# Patient Record
Sex: Female | Born: 1970 | Race: Black or African American | Marital: Married | Smoking: Never smoker
Health system: Southern US, Community
[De-identification: ages and names within clinical notes are randomized; demographics above are authoritative.]

## PROBLEM LIST (undated history)

## (undated) DIAGNOSIS — Z1371 Encounter for nonprocreative screening for genetic disease carrier status: Secondary | ICD-10-CM

## (undated) DIAGNOSIS — E7800 Pure hypercholesterolemia, unspecified: Secondary | ICD-10-CM

## (undated) DIAGNOSIS — Z803 Family history of malignant neoplasm of breast: Secondary | ICD-10-CM

## (undated) DIAGNOSIS — I499 Cardiac arrhythmia, unspecified: Secondary | ICD-10-CM

## (undated) DIAGNOSIS — Z9189 Other specified personal risk factors, not elsewhere classified: Secondary | ICD-10-CM

## (undated) HISTORY — PX: ESSURE TUBAL LIGATION: SUR464

## (undated) HISTORY — DX: Pure hypercholesterolemia, unspecified: E78.00

## (undated) HISTORY — PX: COMBINED HYSTEROSCOPY DIAGNOSTIC / D&C: SUR297

## (undated) HISTORY — PX: CERVICAL CERCLAGE: SHX1329

## (undated) HISTORY — DX: Family history of malignant neoplasm of breast: Z80.3

---

## 2004-10-23 ENCOUNTER — Inpatient Hospital Stay: Payer: Self-pay | Admitting: Obstetrics & Gynecology

## 2004-10-28 ENCOUNTER — Inpatient Hospital Stay: Payer: Self-pay | Admitting: Obstetrics & Gynecology

## 2005-12-14 ENCOUNTER — Observation Stay: Payer: Self-pay | Admitting: Obstetrics & Gynecology

## 2009-09-04 ENCOUNTER — Encounter: Payer: Self-pay | Admitting: Obstetrics and Gynecology

## 2009-09-21 ENCOUNTER — Encounter: Payer: Self-pay | Admitting: Obstetrics and Gynecology

## 2009-10-26 ENCOUNTER — Encounter: Payer: Self-pay | Admitting: Maternal & Fetal Medicine

## 2010-03-05 ENCOUNTER — Observation Stay: Payer: Self-pay

## 2010-03-08 ENCOUNTER — Observation Stay: Payer: Self-pay | Admitting: Obstetrics and Gynecology

## 2010-03-15 ENCOUNTER — Inpatient Hospital Stay: Payer: Self-pay

## 2010-08-17 ENCOUNTER — Ambulatory Visit: Payer: Self-pay

## 2013-11-11 ENCOUNTER — Ambulatory Visit: Payer: Self-pay | Admitting: Obstetrics & Gynecology

## 2015-01-07 ENCOUNTER — Encounter: Payer: Self-pay | Admitting: Emergency Medicine

## 2015-01-07 ENCOUNTER — Emergency Department: Payer: No Typology Code available for payment source

## 2015-01-07 ENCOUNTER — Emergency Department
Admission: EM | Admit: 2015-01-07 | Discharge: 2015-01-07 | Disposition: A | Discharge status: Home / Self Care | Payer: No Typology Code available for payment source | Attending: Emergency Medicine | Admitting: Emergency Medicine

## 2015-01-07 DIAGNOSIS — Y92410 Unspecified street and highway as the place of occurrence of the external cause: Secondary | ICD-10-CM | POA: Insufficient documentation

## 2015-01-07 DIAGNOSIS — S20219A Contusion of unspecified front wall of thorax, initial encounter: Secondary | ICD-10-CM | POA: Insufficient documentation

## 2015-01-07 DIAGNOSIS — Y9389 Activity, other specified: Secondary | ICD-10-CM | POA: Diagnosis not present

## 2015-01-07 DIAGNOSIS — S299XXA Unspecified injury of thorax, initial encounter: Secondary | ICD-10-CM | POA: Diagnosis present

## 2015-01-07 DIAGNOSIS — S60221A Contusion of right hand, initial encounter: Secondary | ICD-10-CM | POA: Insufficient documentation

## 2015-01-07 DIAGNOSIS — Y998 Other external cause status: Secondary | ICD-10-CM | POA: Insufficient documentation

## 2015-01-07 MED ORDER — HYDROCODONE-ACETAMINOPHEN 5-325 MG PO TABS: 1.0000 | ORAL_TABLET | ORAL | Status: DC | PRN
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
## 2015-01-07 NOTE — Discharge Instructions (Signed)
Chest Contusion A contusion is a deep bruise. Bruises happen when an injury causes bleeding under the skin. Signs of bruising include pain, puffiness (swelling), and discolored skin. The bruise may turn blue, purple, or yellow.  HOME CARE  Put ice on the injured area.  Put ice in a plastic bag.  Place a towel between the skin and the bag.  Leave the ice on for 15-20 minutes at a time, 03-04 times a day for the first 48 hours.  Only take medicine as told by your doctor.  Rest.  Take deep breaths (deep-breathing exercises) as told by your doctor.  Stop smoking if you smoke.  Do not lift objects over 5 pounds (2.3 kilograms) for 3 days or longer if told by your doctor. GET HELP RIGHT AWAY IF:   You have more bruising or puffiness.  You have pain that gets worse.  You have trouble breathing.  You are dizzy, weak, or pass out (faint).  You have blood in your pee (urine) or poop (stool).  You cough up or throw up (vomit) blood.  Your puffiness or pain is not helped with medicines. MAKE SURE YOU:   Understand these instructions.  Will watch your condition.  Will get help right away if you are not doing well or get worse. Document Released: 10/30/2007 Document Revised: 02/05/2012 Document Reviewed: 11/04/2011 [facility] Patient Information 2015 [place], [place]. This information is not intended to replace advice given to you by your health care provider. Make sure you discuss any questions you have with your health care provider.

## 2015-01-10 ENCOUNTER — Encounter: Payer: Self-pay | Admitting: Emergency Medicine

## 2015-01-10 ENCOUNTER — Ambulatory Visit
Admission: EM | Admit: 2015-01-10 | Discharge: 2015-01-10 | Disposition: A | Discharge status: Home / Self Care | Payer: No Typology Code available for payment source | Attending: Family Medicine | Admitting: Family Medicine

## 2015-01-10 DIAGNOSIS — S60011D Contusion of right thumb without damage to nail, subsequent encounter: Secondary | ICD-10-CM

## 2015-01-10 DIAGNOSIS — S60011A Contusion of right thumb without damage to nail, initial encounter: Secondary | ICD-10-CM

## 2015-01-10 MED ORDER — MELOXICAM 15 MG PO TABS: 15.0000 mg | ORAL_TABLET | Freq: Every day | ORAL | Status: DC

## 2015-01-10 NOTE — Discharge Instructions (Signed)
Contusion °A contusion is a deep bruise. Contusions are the result of an injury that caused bleeding under the skin. The contusion may turn blue, purple, or yellow. Minor injuries will give you a painless contusion, but more severe contusions may stay painful and swollen for a few weeks.  °CAUSES  °A contusion is usually caused by a blow, trauma, or direct force to an area of the body. °SYMPTOMS  °· Swelling and redness of the injured area. °· Bruising of the injured area. °· Tenderness and soreness of the injured area. °· Pain. °DIAGNOSIS  °The diagnosis can be made by taking a history and physical exam. An X-ray, CT scan, or MRI may be needed to determine if there were any associated injuries, such as fractures. °TREATMENT  °Specific treatment will depend on what area of the body was injured. In general, the best treatment for a contusion is resting, icing, elevating, and applying cold compresses to the injured area. Over-the-counter medicines may also be recommended for pain control. Ask your caregiver what the best treatment is for your contusion. °HOME CARE INSTRUCTIONS  °· Put ice on the injured area. °¨ Put ice in a plastic bag. °¨ Place a towel between your skin and the bag. °¨ Leave the ice on for 15-20 minutes, 3-4 times a day, or as directed by your health care provider. °· Only take over-the-counter or prescription medicines for pain, discomfort, or fever as directed by your caregiver. Your caregiver may recommend avoiding anti-inflammatory medicines (aspirin, ibuprofen, and naproxen) for 48 hours because these medicines may increase bruising. °· Rest the injured area. °· If possible, elevate the injured area to reduce swelling. °SEEK IMMEDIATE MEDICAL CARE IF:  °· You have increased bruising or swelling. °· You have pain that is getting worse. °· Your swelling or pain is not relieved with medicines. °MAKE SURE YOU:  °· Understand these instructions. °· Will watch your condition. °· Will get help right  away if you are not doing well or get worse. °Document Released: 02/20/2005 Document Revised: 05/18/2013 Document Reviewed: 03/18/2011 °[employer]® Patient Information ©2015 [employer]. This information is not intended to replace advice given to you by your health care provider. Make sure you discuss any questions you have with your health care provider. ° °
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
## 2015-02-28 LAB — HM MAMMOGRAPHY

## 2015-06-12 ENCOUNTER — Ambulatory Visit: Payer: Self-pay | Admitting: Dietician

## 2016-03-07 LAB — HM PAP SMEAR: HM PAP: NEGATIVE

## 2016-04-21 ENCOUNTER — Ambulatory Visit
Admission: EM | Admit: 2016-04-21 | Discharge: 2016-04-21 | Disposition: A | Discharge status: Home / Self Care | Payer: BLUE CROSS/BLUE SHIELD | Attending: Emergency Medicine | Admitting: Emergency Medicine

## 2016-04-21 ENCOUNTER — Encounter: Payer: Self-pay | Admitting: Emergency Medicine

## 2016-04-21 DIAGNOSIS — J069 Acute upper respiratory infection, unspecified: Secondary | ICD-10-CM

## 2016-04-21 DIAGNOSIS — M542 Cervicalgia: Secondary | ICD-10-CM

## 2016-04-21 DIAGNOSIS — M62838 Other muscle spasm: Secondary | ICD-10-CM

## 2016-04-21 MED ORDER — HYDROCOD POLST-CPM POLST ER 10-8 MG/5ML PO SUER: 5.0000 mL | Freq: Two times a day (BID) | ORAL | 0 refills | Status: DC | PRN

## 2016-04-21 MED ORDER — MOMETASONE FUROATE 50 MCG/ACT NA SUSP: 2.0000 | Freq: Every day | NASAL | 0 refills | Status: DC

## 2016-04-21 MED ORDER — METAXALONE 800 MG PO TABS: 800.0000 mg | ORAL_TABLET | Freq: Three times a day (TID) | ORAL | 0 refills | Status: DC

## 2016-04-21 MED ORDER — IBUPROFEN 800 MG PO TABS: 800.0000 mg | ORAL_TABLET | Freq: Three times a day (TID) | ORAL | 0 refills | Status: DC

## 2016-04-21 NOTE — ED Triage Notes (Signed)
Patient c/o HAs, nasal congestion, runny nose, and cough for the past 3-4 days. Patient denies fevers.

## 2016-04-21 NOTE — ED Provider Notes (Signed)
HPI  SUBJECTIVE:  Amy Buckley is a 45 y.o. female who presents with scratchy sore throat, nasal congestion, postnasal drip, nonproductive cough, right ear popping and fullness for one week. Can't sleep at night secondary to cough. Also describes intermittent right neck pain of variable duration that she describes as a pulled muscle for the past 4 days. She reports stiffness in the right side, no stiffness with flexion and extension. Symptoms are worse at night with lying down, head rotation, better with ibuprofen 600 mg and heat no antipyretic in the past 6-8 hours. No fevers, bodyaches, neck or facial rash. No numbness, daily, weakness in arm. No trauma to her neck. She is not sure she slept on it funny. She states that she had solution. This before, was thought to be a neck strain and it resolved on its own. Past medical history negative for diabetes, hypertension, asthma, emphysema, COPD, in a cup of ice. She is not a smoker. LMP: 2 years ago. JQZ:ESPQZ N McLean-Scocuzza, MD   History reviewed. No pertinent past medical history.  History reviewed. No pertinent surgical history.  History reviewed. No pertinent family history.  Social History  Substance Use Topics  . Smoking status: Never Smoker  . Smokeless tobacco: Never Used  . Alcohol use No    No current facility-administered medications for this encounter.   Current Outpatient Prescriptions:  .  chlorpheniramine-HYDROcodone (TUSSIONEX PENNKINETIC ER) 10-8 MG/5ML SUER, Take 5 mLs by mouth every 12 (twelve) hours as needed for cough., Disp: 120 mL, Rfl: 0 .  ibuprofen (ADVIL,MOTRIN) 800 MG tablet, Take 1 tablet (800 mg total) by mouth 3 (three) times daily., Disp: 30 tablet, Rfl: 0 .  metaxalone (SKELAXIN) 800 MG tablet, Take 1 tablet (800 mg total) by mouth 3 (three) times daily., Disp: 21 tablet, Rfl: 0 .  mometasone (NASONEX) 50 MCG/ACT nasal spray, Place 2 sprays into the nose daily., Disp: 17 g, Rfl: 0  No Known  Allergies   ROS  As noted in HPI.   Physical Exam  BP (!) 143/86 (BP Location: Left Arm)   Pulse 73   Temp 98.6 F (37 C) (Oral)   Resp 16   Ht 5' 8"  (1.727 m)   Wt 231 lb (104.8 kg)   SpO2 100%   BMI 35.12 kg/m   Constitutional: Well developed, well nourished, no acute distress Eyes:  EOMI, conjunctiva normal bilaterally HENT: Normocephalic, atraumatic,mucus membranes moist. Mild nasal congestion, no sinus tenderness. TMs normal bilaterally. No air-fluid levels. Difficult to visualize posterior oropharynx but does not appear red. Tonsils appear normal, uvula midline. Neck: Positive right-sided strap muscle and trapezial tenderness with muscle spasm. No meningismus. No rash. No cervical lymph nodes. Respiratory: Normal inspiratory effort lungs clear bilaterally good air movement no chest wall tenderness Cardiovascular: Normal rate regular rhythm no murmurs rubs or gallops GI: nondistended skin: No rash, skin intact Musculoskeletal: no deformities Neurologic: Alert & oriented x 3, no focal neuro deficits Psychiatric: Speech and behavior appropriate   ED Course   Medications - No data to display  No orders of the defined types were placed in this encounter.   No results found for this or any previous visit (from the past 24 hour(s)). No results found.  ED Clinical Impression  Upper respiratory tract infection, unspecified type  Neck pain  Muscle spasm   ED Assessment/Plan  Presentation most consistent with URI and neck strain/muscle spasm. Doubt meningitis or shingles at this point in time, no evidence of otitis, pneumonia. We'll  send home with ibuprofen 800 mg 3 times a day with Tylenol, tramadol for severe pain, muscle relaxant, and a cough syrup, Nasonex. She is to start Mucinex D and saline nasal irrigation no indications for antibiotics at this time. Follow up with PMD as needed.  Discussed  MDM, plan and followup with patient. Discussed sn/sx that should  prompt return to the ED. Patient agrees with plan.   Meds ordered this encounter  Medications  . ibuprofen (ADVIL,MOTRIN) 800 MG tablet    Sig: Take 1 tablet (800 mg total) by mouth 3 (three) times daily.    Dispense:  30 tablet    Refill:  0  . mometasone (NASONEX) 50 MCG/ACT nasal spray    Sig: Place 2 sprays into the nose daily.    Dispense:  17 g    Refill:  0  . chlorpheniramine-HYDROcodone (TUSSIONEX PENNKINETIC ER) 10-8 MG/5ML SUER    Sig: Take 5 mLs by mouth every 12 (twelve) hours as needed for cough.    Dispense:  120 mL    Refill:  0  . metaxalone (SKELAXIN) 800 MG tablet    Sig: Take 1 tablet (800 mg total) by mouth 3 (three) times daily.    Dispense:  21 tablet    Refill:  0    *This clinic note was created using [employer]. Therefore, there may be occasional mistakes despite careful proofreading.  ?   Melynda Ripple, MD 04/21/16 1900

## 2016-04-21 NOTE — Discharge Instructions (Signed)
Start Mucinex D and saline nasal irrigation. Use a neti pot or Milta Deiters med sinus rinse This will help with the nasal congestion and postnasal drip, which will help decrease your cough.

## 2016-04-23 ENCOUNTER — Other Ambulatory Visit (HOSPITAL_COMMUNITY): Payer: Self-pay | Admitting: *Deleted

## 2016-04-24 ENCOUNTER — Ambulatory Visit (HOSPITAL_COMMUNITY)
Admission: RE | Admit: 2016-04-24 | Discharge: 2016-04-24 | Disposition: A | Discharge status: Home / Self Care | Payer: BLUE CROSS/BLUE SHIELD | Source: Ambulatory Visit | Attending: Internal Medicine | Admitting: Internal Medicine

## 2016-04-24 DIAGNOSIS — E2740 Unspecified adrenocortical insufficiency: Secondary | ICD-10-CM | POA: Diagnosis present

## 2016-04-24 LAB — CORTISOL-AM, BLOOD: Cortisol - AM: 18.2 ug/dL (ref 6.7–22.6)

## 2016-04-24 LAB — ACTH STIMULATION, 3 TIME POINTS
CORTISOL 30 MIN: 13.4 ug/dL
CORTISOL 60 MIN: 16.8 ug/dL
CORTISOL BASE: 6.7 ug/dL

## 2016-04-24 MED ORDER — COSYNTROPIN 0.25 MG IJ SOLR
0.2500 mg | Freq: Once | INTRAMUSCULAR | Status: AC
  Administered 2016-04-24: 0.25 mg via INTRAVENOUS
  Filled 2016-04-24: qty 0.25

## 2017-04-10 ENCOUNTER — Encounter: Payer: Self-pay | Admitting: Obstetrics & Gynecology

## 2017-04-10 ENCOUNTER — Ambulatory Visit (INDEPENDENT_AMBULATORY_CARE_PROVIDER_SITE_OTHER): Payer: BLUE CROSS/BLUE SHIELD | Admitting: Obstetrics & Gynecology

## 2017-04-10 VITALS — BP 120/80 | HR 83 | Ht 68.0 in | Wt 230.0 lb

## 2017-04-10 DIAGNOSIS — Z01419 Encounter for gynecological examination (general) (routine) without abnormal findings: Secondary | ICD-10-CM | POA: Diagnosis not present

## 2017-04-10 DIAGNOSIS — N912 Amenorrhea, unspecified: Secondary | ICD-10-CM | POA: Diagnosis not present

## 2017-04-10 DIAGNOSIS — Z0000 Encounter for general adult medical examination without abnormal findings: Secondary | ICD-10-CM

## 2017-04-10 DIAGNOSIS — Z1239 Encounter for other screening for malignant neoplasm of breast: Secondary | ICD-10-CM

## 2017-04-10 DIAGNOSIS — Z1231 Encounter for screening mammogram for malignant neoplasm of breast: Secondary | ICD-10-CM | POA: Diagnosis not present

## 2017-04-10 NOTE — Progress Notes (Signed)
HPI:      [name] is a 46 y.o. [phone] who LMP was 2 years ago,she presents today for her annual examination. The patient has no complaints today. The patient is sexually active. Her last pap: approximate date 2017 and was normal and last mammogram: approximate date 2016 and was normal. The patient does perform self breast exams.  There is notable family history of breast or ovarian cancer in her family.  The patient has regular exercise: yes.  The patient denies current symptoms of depression.  No period in 2 years.  Prior Essure.  No hormones.  Rare hot flash.  GYN History: Contraception: tubal ligation  PMHx: History reviewed. No pertinent past medical history. Past Surgical History:  Procedure Laterality Date  . CERVICAL CERCLAGE    . CESAREAN SECTION    . COMBINED HYSTEROSCOPY DIAGNOSTIC / D&C    . ESSURE TUBAL LIGATION     Family History  Problem Relation Age of Onset  . Diabetes Mother   . Hypertension Mother   . Breast cancer Mother    Social History   Tobacco Use  . Smoking status: Never Smoker  . Smokeless tobacco: Never Used  Substance Use Topics  . Alcohol use: No  . Drug use: No  Child is 52 years old at [facility]  Current Outpatient Medications:  .  pantoprazole (PROTONIX) 20 MG tablet, , Disp: , Rfl:  .  chlorpheniramine-HYDROcodone (TUSSIONEX PENNKINETIC ER) 10-8 MG/5ML SUER, Take 5 mLs by mouth every 12 (twelve) hours as needed for cough. (Patient not taking: Reported on 04/10/2017), Disp: 120 mL, Rfl: 0 .  ibuprofen (ADVIL,MOTRIN) 800 MG tablet, Take 1 tablet (800 mg total) by mouth 3 (three) times daily. (Patient not taking: Reported on 04/10/2017), Disp: 30 tablet, Rfl: 0 .  metaxalone (SKELAXIN) 800 MG tablet, Take 1 tablet (800 mg total) by mouth 3 (three) times daily. (Patient not taking: Reported on 04/10/2017), Disp: 21 tablet, Rfl: 0 .  mometasone (NASONEX) 50 MCG/ACT nasal spray, Place 2 sprays into the nose daily. (Patient not taking:  Reported on 04/10/2017), Disp: 17 g, Rfl: 0 Allergies: Patient has no known allergies.  Review of Systems  Constitutional: Negative for chills, fever and malaise/fatigue.  HENT: Negative for congestion, sinus pain and sore throat.   Eyes: Negative for blurred vision and pain.  Respiratory: Negative for cough and wheezing.   Cardiovascular: Negative for chest pain and leg swelling.  Gastrointestinal: Negative for abdominal pain, constipation, diarrhea, heartburn, nausea and vomiting.  Genitourinary: Negative for dysuria, frequency, hematuria and urgency.  Musculoskeletal: Negative for back pain, joint pain, myalgias and neck pain.  Skin: Negative for itching and rash.  Neurological: Negative for dizziness, tremors and weakness.  Endo/Heme/Allergies: Does not bruise/bleed easily.  Psychiatric/Behavioral: Negative for depression. The patient is not nervous/anxious and does not have insomnia.    Objective: BP 120/80   Pulse 83   Ht 5' 8"  (1.727 m)   Wt 230 lb (104.3 kg)   BMI 34.97 kg/m   Filed Weights   04/10/17 0954  Weight: 230 lb (104.3 kg)   Body mass index is 34.97 kg/m. Physical Exam  Constitutional: She is oriented to person, place, and time. She appears well-developed and well-nourished. No distress.  Genitourinary: Rectum normal, vagina normal and uterus normal. Pelvic exam was performed with patient supine. There is no rash or lesion on the right labia. There is no rash or lesion on the left labia. Vagina exhibits no lesion. No bleeding  in the vagina. Right adnexum does not display mass and does not display tenderness. Left adnexum does not display mass and does not display tenderness. Cervix does not exhibit motion tenderness, lesion, friability or polyp.   Uterus is mobile and midaxial. Uterus is not enlarged or exhibiting a mass.  HENT:  Head: Normocephalic and atraumatic. Head is without laceration.  Right Ear: Hearing normal.  Left Ear: Hearing normal.  Nose: No  epistaxis.  No foreign bodies.  Mouth/Throat: Uvula is midline, oropharynx is clear and moist and mucous membranes are normal.  Eyes: Pupils are equal, round, and reactive to light.  Neck: Normal range of motion. Neck supple. No thyromegaly present.  Cardiovascular: Normal rate and regular rhythm. Exam reveals no gallop and no friction rub.  No murmur heard. Pulmonary/Chest: Effort normal and breath sounds normal. No respiratory distress. She has no wheezes. Right breast exhibits no mass, no skin change and no tenderness. Left breast exhibits no mass, no skin change and no tenderness.  Abdominal: Soft. Bowel sounds are normal. She exhibits no distension. There is no tenderness. There is no rebound.  Musculoskeletal: Normal range of motion.  Neurological: She is alert and oriented to person, place, and time. No cranial nerve deficit.  Skin: Skin is warm and dry.  Psychiatric: She has a normal mood and affect. Judgment normal.  Vitals reviewed.  Assessment:  ANNUAL EXAM 1. Annual physical exam   2. Screening for breast cancer   3. Amenorrhea    Screening Plan:            1.  Cervical Screening-  Pap smear schedule reviewed with patient, due 2020  2. Breast screening- Exam annually and mammogram>40 planned   3. Labs today due to amenorrhea  4. Contraception: bilateral tubal ligation    5. Amenorrhea - FSH/LH - Testosterone, Free, Total, SHBG - TSH - [place] - Concern for amenorrhea and uterine risk for hyperplasia/cancer discussed Consideration for EMB, Provera, other based on results    F/U  Return in about 1 year (around 04/10/2018) for Annual.  Barnett Applebaum, MD, [facility], [facility] 04/10/2017  10:17 AM

## 2017-04-10 NOTE — Patient Instructions (Signed)
PAP every three years Mammogram every year    Call [phone] to schedule at [facility] yearly (with PCP)

## 2017-04-12 LAB — TESTOSTERONE, FREE, TOTAL, SHBG
SEX HORMONE BINDING: 60.6 nmol/L (ref 24.6–122.0)
Testosterone, Free: 2.4 pg/mL (ref 0.0–4.2)
Testosterone: 29 ng/dL (ref 8–48)

## 2017-04-12 LAB — FSH/LH
FSH: 57.7 m[IU]/mL
LH: 37.9 m[IU]/mL

## 2017-04-12 LAB — TSH: TSH: 1.15 u[IU]/mL (ref 0.450–4.500)

## 2017-04-15 ENCOUNTER — Encounter: Payer: Self-pay | Admitting: Obstetrics and Gynecology

## 2017-04-16 ENCOUNTER — Telehealth: Payer: Self-pay

## 2017-04-16 NOTE — Telephone Encounter (Signed)
Pt inquiring about recent lab results. OM#[phone]

## 2017-04-16 NOTE — Telephone Encounter (Signed)
Please advise 

## 2017-04-19 NOTE — Telephone Encounter (Signed)
Will discus at next appt (this week).  Let her know all OK.

## 2017-04-21 NOTE — Telephone Encounter (Signed)
Called pt no answer °

## 2017-04-21 NOTE — Telephone Encounter (Signed)
Called again no answer

## 2017-04-25 ENCOUNTER — Encounter: Payer: Self-pay | Admitting: Obstetrics & Gynecology

## 2017-04-25 ENCOUNTER — Ambulatory Visit: Payer: BLUE CROSS/BLUE SHIELD | Admitting: Obstetrics & Gynecology

## 2017-04-25 ENCOUNTER — Ambulatory Visit (INDEPENDENT_AMBULATORY_CARE_PROVIDER_SITE_OTHER): Payer: BLUE CROSS/BLUE SHIELD

## 2017-04-25 VITALS — BP 120/80 | HR 57 | Ht 68.0 in | Wt 233.0 lb

## 2017-04-25 DIAGNOSIS — N912 Amenorrhea, unspecified: Secondary | ICD-10-CM

## 2017-04-25 NOTE — Progress Notes (Signed)
  HPI: Amenorrhea Patient presents for evaluation of amenorrhea. Currently periods are absent. Bleeding is none. Periods were not regular in the past occurring every a few months. Patient has no relevant history of abnormal sexual development and recent labs c/w elevated LH and FSH but normal test and TSH. Is there a chance of pregnancy? no. HCG lab test done? no, not done. Factors that may be contributory to menstrual abnormalities include: early menopause, obesity, PCOS. Previous treatments for menstrual abnormalities include: none.  Ultrasound demonstrates no masses seen, ES 3 mm, no cysts These findings are Pelvis normal    PMHx: She  has a past medical history of Family history of breast cancer. Also,  has a past surgical history that includes Essure tubal ligation; Cesarean section; Combined hysteroscopy diagnostic / D&C; and Cervical cerclage., family history includes Breast cancer (age of onset: 4) in her mother; Diabetes in her mother; Hypertension in her mother; Lung cancer (age of onset: 50) in her father.,  reports that  has never smoked. she has never used smokeless tobacco. She reports that she does not drink alcohol or use drugs.  She has a current medication list which includes the following prescription(s): chlorpheniramine-hydrocodone, ibuprofen, metaxalone, mometasone, and pantoprazole. Also, has No Known Allergies.  Review of Systems  All other systems reviewed and are negative.  Objective: BP 120/80   Pulse (!) 57   Ht 5' 8"  (1.727 m)   Wt 233 lb (105.7 kg)   BMI 35.43 kg/m   Physical examination Constitutional NAD, Conversant  Skin No rashes, lesions or ulceration.   Extremities: Moves all appropriately.  Normal ROM for age. No lymphadenopathy.  Neuro: Grossly intact  Psych: Oriented to PPT.  Normal mood. Normal affect.   Assessment:  Amenorrhea  As hormones (labs) suggest menopause and [place] normal with no endometrial thickening, no need for medical or surgical  therapy.  Monitor for s/sx menopause.  A total of 15 minutes were spent face-to-face with the patient during this encounter and over half of that time dealt with counseling and coordination of care.  Barnett Applebaum, MD, [facility], [facility] 04/25/2017  10:50 AM

## 2017-05-01 ENCOUNTER — Ambulatory Visit
Admission: RE | Admit: 2017-05-01 | Discharge: 2017-05-01 | Disposition: A | Discharge status: Home / Self Care | Payer: BLUE CROSS/BLUE SHIELD | Source: Ambulatory Visit | Attending: Obstetrics & Gynecology | Admitting: Obstetrics & Gynecology

## 2017-05-01 DIAGNOSIS — Z1231 Encounter for screening mammogram for malignant neoplasm of breast: Secondary | ICD-10-CM | POA: Insufficient documentation

## 2017-05-01 DIAGNOSIS — Z1239 Encounter for other screening for malignant neoplasm of breast: Secondary | ICD-10-CM

## 2017-05-02 ENCOUNTER — Inpatient Hospital Stay
Admission: RE | Admit: 2017-05-02 | Discharge: 2017-05-02 | Disposition: A | Discharge status: Home / Self Care | Payer: Self-pay | Source: Ambulatory Visit | Attending: *Deleted | Admitting: *Deleted

## 2017-05-02 ENCOUNTER — Other Ambulatory Visit: Payer: Self-pay | Admitting: *Deleted

## 2017-05-02 DIAGNOSIS — Z9289 Personal history of other medical treatment: Secondary | ICD-10-CM

## 2018-03-27 DIAGNOSIS — Z9189 Other specified personal risk factors, not elsewhere classified: Secondary | ICD-10-CM

## 2018-03-27 DIAGNOSIS — Z1371 Encounter for nonprocreative screening for genetic disease carrier status: Secondary | ICD-10-CM

## 2018-03-27 HISTORY — DX: Other specified personal risk factors, not elsewhere classified: Z91.89

## 2018-03-27 HISTORY — DX: Encounter for nonprocreative screening for genetic disease carrier status: Z13.71

## 2018-04-13 ENCOUNTER — Ambulatory Visit: Payer: BLUE CROSS/BLUE SHIELD | Admitting: Obstetrics & Gynecology

## 2018-04-16 ENCOUNTER — Encounter: Payer: Self-pay | Admitting: Obstetrics & Gynecology

## 2018-04-16 ENCOUNTER — Ambulatory Visit (INDEPENDENT_AMBULATORY_CARE_PROVIDER_SITE_OTHER): Payer: BLUE CROSS/BLUE SHIELD | Admitting: Obstetrics & Gynecology

## 2018-04-16 VITALS — BP 120/80 | Ht 68.0 in | Wt 226.0 lb

## 2018-04-16 DIAGNOSIS — Z1211 Encounter for screening for malignant neoplasm of colon: Secondary | ICD-10-CM

## 2018-04-16 DIAGNOSIS — Z01419 Encounter for gynecological examination (general) (routine) without abnormal findings: Secondary | ICD-10-CM

## 2018-04-16 DIAGNOSIS — Z1239 Encounter for other screening for malignant neoplasm of breast: Secondary | ICD-10-CM

## 2018-04-16 DIAGNOSIS — Z803 Family history of malignant neoplasm of breast: Secondary | ICD-10-CM | POA: Insufficient documentation

## 2018-04-16 DIAGNOSIS — Z780 Asymptomatic menopausal state: Secondary | ICD-10-CM | POA: Insufficient documentation

## 2018-04-16 DIAGNOSIS — Z0000 Encounter for general adult medical examination without abnormal findings: Secondary | ICD-10-CM

## 2018-04-16 NOTE — Patient Instructions (Addendum)
PAP every three years Mammogram every year    Call [phone] to schedule at [facility] Colonoscopy every 10 years, schedule soon Labs yearly (with PCP)

## 2018-04-16 NOTE — Progress Notes (Signed)
HPI:      [name] is a 47 y.o. [phone] who LMP was in the past, she presents today for her annual examination.  The patient has no complaints today. The patient is sexually active. Herlast pap: approximate date 2017 and was normal and last mammogram: approximate date 2018 and was normal.  The patient does perform self breast exams.  There is notable family history of breast or ovarian cancer in her family- BREAST CANCER; prior BRCA NEG testing done around 2011. The patient is not taking hormone replacement therapy. Patient denies post-menopausal vaginal bleeding.   The patient has regular exercise: yes. The patient denies current symptoms of depression.  Occas urgency, rare LOU.  No nocturia.  Occas hot flash  GYN Hx: Last Colonoscopy:never ago.  PMHx: Past Medical History:  Diagnosis Date  . Family history of breast cancer    11/18 genetic testing letter sent  . Hypercholesterolemia    Past Surgical History:  Procedure Laterality Date  . CERVICAL CERCLAGE    . CESAREAN SECTION    . COMBINED HYSTEROSCOPY DIAGNOSTIC / D&C    . ESSURE TUBAL LIGATION     Family History  Problem Relation Age of Onset  . Diabetes Mother   . Hypertension Mother   . Breast cancer Mother 73  . Lung cancer Father 56   Social History   Tobacco Use  . Smoking status: Never Smoker  . Smokeless tobacco: Never Used  Substance Use Topics  . Alcohol use: No  . Drug use: No    Current Outpatient Medications:  .  chlorpheniramine-HYDROcodone (TUSSIONEX PENNKINETIC ER) 10-8 MG/5ML SUER, Take 5 mLs by mouth every 12 (twelve) hours as needed for cough. (Patient not taking: Reported on 04/10/2017), Disp: 120 mL, Rfl: 0 .  ibuprofen (ADVIL,MOTRIN) 800 MG tablet, Take 1 tablet (800 mg total) by mouth 3 (three) times daily. (Patient not taking: Reported on 04/10/2017), Disp: 30 tablet, Rfl: 0 .  metaxalone (SKELAXIN) 800 MG tablet, Take 1 tablet (800 mg total) by mouth 3 (three) times daily. (Patient  not taking: Reported on 04/10/2017), Disp: 21 tablet, Rfl: 0 .  mometasone (NASONEX) 50 MCG/ACT nasal spray, Place 2 sprays into the nose daily. (Patient not taking: Reported on 04/10/2017), Disp: 17 g, Rfl: 0 .  pantoprazole (PROTONIX) 20 MG tablet, , Disp: , Rfl:  Allergies: Patient has no known allergies.  Review of Systems  Constitutional: Negative for chills, fever and malaise/fatigue.  HENT: Negative for congestion, sinus pain and sore throat.   Eyes: Negative for blurred vision and pain.  Respiratory: Negative for cough and wheezing.   Cardiovascular: Negative for chest pain and leg swelling.  Gastrointestinal: Negative for abdominal pain, constipation, diarrhea, heartburn, nausea and vomiting.  Genitourinary: Negative for dysuria, frequency, hematuria and urgency.  Musculoskeletal: Negative for back pain, joint pain, myalgias and neck pain.  Skin: Negative for itching and rash.  Neurological: Negative for dizziness, tremors and weakness.  Endo/Heme/Allergies: Does not bruise/bleed easily.  Psychiatric/Behavioral: Negative for depression. The patient is not nervous/anxious and does not have insomnia.     Objective: BP 120/80   Ht _0  (1.727 m)   Wt 226 lb (102.5 kg)   BMI 34.36 kg/m   Filed Weights   04/16/18 1032  Weight: 226 lb (102.5 kg)   Body mass index is 34.36 kg/m. Physical Exam  Constitutional: She is oriented to person, place, and time. She appears well-developed and well-nourished. No distress.  Genitourinary: Rectum normal, vagina normal and  uterus normal. Pelvic exam was performed with patient supine. There is no rash or lesion on the right labia. There is no rash or lesion on the left labia. Vagina exhibits no lesion. No bleeding in the vagina. Right adnexum does not display mass and does not display tenderness. Left adnexum does not display mass and does not display tenderness. Cervix does not exhibit motion tenderness, lesion, friability or polyp.   Uterus  is mobile and midaxial. Uterus is not enlarged or exhibiting a mass.  HENT:  Head: Normocephalic and atraumatic. Head is without laceration.  Right Ear: Hearing normal.  Left Ear: Hearing normal.  Nose: No epistaxis.  No foreign bodies.  Mouth/Throat: Uvula is midline, oropharynx is clear and moist and mucous membranes are normal.  Eyes: Pupils are equal, round, and reactive to light.  Neck: Normal range of motion. Neck supple. No thyromegaly present.  Cardiovascular: Normal rate and regular rhythm. Exam reveals no gallop and no friction rub.  No murmur heard. Pulmonary/Chest: Effort normal and breath sounds normal. No respiratory distress. She has no wheezes. Right breast exhibits no mass, no skin change and no tenderness. Left breast exhibits no mass, no skin change and no tenderness.  Abdominal: Soft. Bowel sounds are normal. She exhibits no distension. There is no tenderness. There is no rebound.  Musculoskeletal: Normal range of motion.  Neurological: She is alert and oriented to person, place, and time. No cranial nerve deficit.  Skin: Skin is warm and dry.  Psychiatric: She has a normal mood and affect. Judgment normal.  Vitals reviewed.   Assessment: Annual Exam 1. Annual physical exam   2. Screening for breast cancer   3. Screen for colon cancer   4. Menopause   5. Family history of breast cancer    Plan:            1.  Cervical Screening-  Pap smear schedule reviewed with patient  2. Breast screening- Exam annually and mammogram scheduled soon  3. Colonoscopy every 10 years soon, Hemoccult testing after age 60  4. Labs managed by PCP  5. Counseling for hormonal therapy: none  6. Update testing for her having high risk factors for hereditary cancers discussed (prior BRCA neg done in 2011).  She desires to have done.  7. [place] for menopause    F/U  Return in about 1 year (around 04/17/2019) for Annual.  Barnett Applebaum, MD, [facility], [facility] 04/16/2018  11:05 AM

## 2018-04-17 LAB — FOLLICLE STIMULATING HORMONE: FSH: 60.7 m[IU]/mL

## 2018-05-01 ENCOUNTER — Other Ambulatory Visit: Payer: Self-pay | Admitting: Obstetrics & Gynecology

## 2018-05-01 NOTE — Progress Notes (Unsigned)
Update testing done. T-C model 29% lifetime risk for breast cancer Rec Breast exam and MMG and MRI

## 2018-05-04 ENCOUNTER — Ambulatory Visit
Admission: RE | Admit: 2018-05-04 | Discharge: 2018-05-04 | Disposition: A | Discharge status: Home / Self Care | Payer: BLUE CROSS/BLUE SHIELD | Source: Ambulatory Visit | Attending: Obstetrics & Gynecology | Admitting: Obstetrics & Gynecology

## 2018-05-04 ENCOUNTER — Encounter: Payer: Self-pay | Admitting: Obstetrics and Gynecology

## 2018-05-04 DIAGNOSIS — Z1239 Encounter for other screening for malignant neoplasm of breast: Secondary | ICD-10-CM

## 2018-05-04 HISTORY — DX: Encounter for nonprocreative screening for genetic disease carrier status: Z13.71

## 2018-05-04 HISTORY — DX: Other specified personal risk factors, not elsewhere classified: Z91.89

## 2018-05-11 ENCOUNTER — Encounter: Payer: Self-pay | Admitting: Obstetrics & Gynecology

## 2018-05-11 ENCOUNTER — Other Ambulatory Visit: Payer: Self-pay | Admitting: Obstetrics & Gynecology

## 2018-05-11 ENCOUNTER — Encounter: Payer: Self-pay | Admitting: *Deleted

## 2018-06-05 ENCOUNTER — Encounter: Payer: Self-pay | Admitting: Emergency Medicine

## 2018-06-05 ENCOUNTER — Other Ambulatory Visit: Payer: Self-pay

## 2018-06-05 ENCOUNTER — Ambulatory Visit
Admission: EM | Admit: 2018-06-05 | Discharge: 2018-06-05 | Disposition: A | Discharge status: Home / Self Care | Payer: BLUE CROSS/BLUE SHIELD | Attending: Family Medicine | Admitting: Family Medicine

## 2018-06-05 DIAGNOSIS — M7521 Bicipital tendinitis, right shoulder: Secondary | ICD-10-CM

## 2018-06-05 MED ORDER — NAPROXEN 500 MG PO TABS: 500.0000 mg | ORAL_TABLET | Freq: Two times a day (BID) | ORAL | 0 refills | Status: DC

## 2018-06-05 NOTE — ED Provider Notes (Signed)
[facility]    CSN: [id number] Arrival date & time: 06/05/18  0806     History   Chief Complaint Chief Complaint  Patient presents with  . Shoulder Pain    right    HPI Amy Buckley is a 48 y.o. female.   HPI  -year-old female presents with right shoulder pain that started on Wednesday morning 2 days before this visit.  States she has very limited range of motion.  Is worse when she attempts to raise her arm is away from her side or trying to comb her hair or fasten her bra.  He works in a [employer] and has been pulling clots with her right dominant hand more than what she had been accustomed to.  Especially on Monday 2 days before the onset of her severe pain.  It is very painful at nighttime when she is sleeping.  Neck is comfortable and without significant pain.  She has no right extremity radiation or numbness.        Past Medical History:  Diagnosis Date  . BRCA negative 03/2018   MyRisk neg except GALNT12 VUS  . Family history of breast cancer   . Hypercholesterolemia   . Increased risk of breast cancer 03/2018   IBIS=29.2%    Patient Active Problem List   Diagnosis Date Noted  . Family history of breast cancer 04/16/2018  . Menopause 04/16/2018  . Annual physical exam 04/10/2017  . Amenorrhea 04/10/2017    Past Surgical History:  Procedure Laterality Date  . CERVICAL CERCLAGE    . CESAREAN SECTION    . COMBINED HYSTEROSCOPY DIAGNOSTIC / D&C    . ESSURE TUBAL LIGATION      OB History    Gravida  6   Para  1   Term  1   Preterm      AB  5   Living  1     SAB  5   TAB      Ectopic      Multiple      Live Births               Home Medications    Prior to Admission medications   Medication Sig Start Date End Date Taking? Authorizing Provider  ibuprofen (ADVIL,MOTRIN) 800 MG tablet Take 1 tablet (800 mg total) by mouth 3 (three) times daily. 04/21/16  Yes Melynda Ripple, MD  naproxen (NAPROSYN) 500 MG  tablet Take 1 tablet (500 mg total) by mouth 2 (two) times daily with a meal. 06/05/18   Lorin Picket, PA-C    Family History Family History  Problem Relation Age of Onset  . Diabetes Mother   . Hypertension Mother   . Breast cancer Mother 59  . Lung cancer Father 62  . Breast cancer Paternal Aunt     Social History Social History   Tobacco Use  . Smoking status: Never Smoker  . Smokeless tobacco: Never Used  Substance Use Topics  . Alcohol use: No  . Drug use: No     Allergies   Patient has no known allergies.   Review of Systems Review of Systems  Constitutional: Positive for activity change. Negative for appetite change, chills, fatigue and fever.  Musculoskeletal: Positive for arthralgias and myalgias.  All other systems reviewed and are negative.    Physical Exam Triage Vital Signs ED Triage Vitals  Enc Vitals Group     BP 06/05/18 0820 125/69     Pulse  Rate 06/05/18 0820 65     Resp 06/05/18 0820 16     Temp 06/05/18 0820 98 F (36.7 C)     Temp Source 06/05/18 0820 Oral     SpO2 06/05/18 0820 100 %     Weight 06/05/18 0817 226 lb (102.5 kg)     Height 06/05/18 0817 5' 8"  (1.727 m)     Head Circumference --      Peak Flow --      Pain Score 06/05/18 0817 8     Pain Loc --      Pain Edu? --      Excl. in [place]? --    No data found.  Updated Vital Signs BP 125/69 (BP Location: Left Arm)   Pulse 65   Temp 98 F (36.7 C) (Oral)   Resp 16   Ht 5' 8"  (1.727 m)   Wt 226 lb (102.5 kg)   SpO2 100%   BMI 34.36 kg/m   Visual Acuity Right Eye Distance:   Left Eye Distance:   Bilateral Distance:    Right Eye Near:   Left Eye Near:    Bilateral Near:     Physical Exam Vitals signs and nursing note reviewed.  Constitutional:      General: She is not in acute distress.    Appearance: Normal appearance. She is not ill-appearing, toxic-appearing or diaphoretic.  HENT:     Head: Normocephalic.     Nose: Nose normal.     Mouth/Throat:      Mouth: Mucous membranes are moist.  Eyes:     General:        Right eye: No discharge.        Left eye: No discharge.     Conjunctiva/sclera: Conjunctivae normal.  Neck:     Musculoskeletal: Normal range of motion and neck supple. No neck rigidity or muscular tenderness.  Musculoskeletal:     Comments: Exam of the right shoulder shows patient to have severe pain so that she does not want to participate with the examination.  He has extremely limited range of motion even passively.  She is very reluctant to allow examination.  There is no tenderness of the clavicle.  Please see neck examination.  Neurovascular function distally is intact.  Maximum tenderness is sharply localized over the coracoid process reproducing her symptoms.  Neurological:     Mental Status: She is alert.      UC Treatments / Results  Labs (all labs ordered are listed, but only abnormal results are displayed) Labs Reviewed - No data to display  EKG None  Radiology No results found.  Procedures Procedures (including critical care time)  Medications Ordered in UC Medications - No data to display  Initial Impression / Assessment and Plan / UC Course  I have reviewed the triage vital signs and the nursing notes.  Pertinent labs & imaging results that were available during my care of the patient were reviewed by me and considered in my medical decision making (see chart for details).   Has a bicipital tendinitis of the proximal.  I have told the patient that she is to do tender and not cooperative today to do a meaningful exam.  I demonstrated pendulum exercises to her that she will perform 3-4 times daily for 1 to 2 minutes each time.  Placed her on Naprosyn for anti-inflammatory effect.  Provided her with a sling that she may use to give her comfort.  Also provided her with the  name of an orthopedic surgeon that she may follow-up for possible injection of the area and more meaningful examination when she has  less painful.   Final Clinical Impressions(s) / UC Diagnoses   Final diagnoses:  Tendonitis of upper biceps tendon of right shoulder     Discharge Instructions     Apply ice 20 minutes out of every 2 hours 4-5 times daily for comfort.  Be sure to do the pendulum exercises that was demonstrated to you today.  Perform them 3-4 times daily 1 to 2 minutes each time.   ED Prescriptions    Medication Sig Dispense Auth. Provider   naproxen (NAPROSYN) 500 MG tablet Take 1 tablet (500 mg total) by mouth 2 (two) times daily with a meal. 60 tablet Lorin Picket, PA-C     Controlled Substance Prescriptions [place] Controlled Substance Registry consulted? Not Applicable   Lorin Picket, PA-C 06/05/18 2015

## 2018-06-05 NOTE — Discharge Instructions (Addendum)
Apply ice 20 minutes out of every 2 hours 4-5 times daily for comfort.  Be sure to do the pendulum exercises that was demonstrated to you today.  Perform them 3-4 times daily 1 to 2 minutes each time.

## 2018-06-05 NOTE — ED Triage Notes (Signed)
Patient c/o right shoulder pain that started on Wed morning.  Patient states that she has limited range of motion in her right shoulder and her pain is worse when she tries to raise her right arm.

## 2018-06-19 ENCOUNTER — Telehealth: Payer: Self-pay | Admitting: Gastroenterology

## 2018-06-19 NOTE — Telephone Encounter (Signed)
Patient called to schedule a colonoscopy after receiving a letter. Please call Monday 06-22-2018 after 3:00pm to schedule.

## 2018-06-22 NOTE — Telephone Encounter (Signed)
Patient stated she discussed her colonoscopy with her PCP to see if she needs to have her colonoscopy.  She said her PCP advised that she did not need to have the colonoscopy at this time since she has no family history of colon cancer.  Referral has been canceled.  Thanks [employer]

## 2019-04-19 ENCOUNTER — Other Ambulatory Visit (HOSPITAL_COMMUNITY)
Admission: RE | Admit: 2019-04-19 | Discharge: 2019-04-19 | Disposition: A | Discharge status: Home / Self Care | Payer: BC Managed Care – PPO | Source: Ambulatory Visit | Attending: Obstetrics & Gynecology | Admitting: Obstetrics & Gynecology

## 2019-04-19 ENCOUNTER — Encounter: Payer: Self-pay | Admitting: Obstetrics & Gynecology

## 2019-04-19 ENCOUNTER — Ambulatory Visit (INDEPENDENT_AMBULATORY_CARE_PROVIDER_SITE_OTHER): Payer: BC Managed Care – PPO | Admitting: Obstetrics & Gynecology

## 2019-04-19 ENCOUNTER — Other Ambulatory Visit: Payer: Self-pay

## 2019-04-19 VITALS — BP 120/80 | Temp 96.6°F | Ht 68.0 in | Wt 229.0 lb

## 2019-04-19 DIAGNOSIS — Z803 Family history of malignant neoplasm of breast: Secondary | ICD-10-CM

## 2019-04-19 DIAGNOSIS — Z124 Encounter for screening for malignant neoplasm of cervix: Secondary | ICD-10-CM | POA: Insufficient documentation

## 2019-04-19 DIAGNOSIS — Z1231 Encounter for screening mammogram for malignant neoplasm of breast: Secondary | ICD-10-CM

## 2019-04-19 DIAGNOSIS — Z01419 Encounter for gynecological examination (general) (routine) without abnormal findings: Secondary | ICD-10-CM

## 2019-04-19 NOTE — Progress Notes (Signed)
HPI:      [name] is a 48 y.o. [phone] who LMP was in the past, she presents today for her annual examination.  The patient has no complaints today. The patient is sexually active. Herlast pap: approximate date 2017 and was normal and last mammogram: approximate date 2019 and was normal.  The patient does perform self breast exams.  There is notable family history of breast or ovarian cancer in her family.  BRCA Neg as well as update testing; TC model 29%. The patient is not taking hormone replacement therapy. Patient denies post-menopausal vaginal bleeding.   The patient has regular exercise: yes. The patient denies current symptoms of depression.    GYN Hx: Last Colonoscopy (not done) but Cologuard:1 year ago. Normal.    PMHx: Past Medical History:  Diagnosis Date  . BRCA negative 03/2018   MyRisk neg except GALNT12 VUS  . Family history of breast cancer   . Hypercholesterolemia   . Increased risk of breast cancer 03/2018   IBIS=29.2%   Past Surgical History:  Procedure Laterality Date  . CERVICAL CERCLAGE    . CESAREAN SECTION    . COMBINED HYSTEROSCOPY DIAGNOSTIC / D&C    . ESSURE TUBAL LIGATION     Family History  Problem Relation Age of Onset  . Diabetes Mother   . Hypertension Mother   . Breast cancer Mother 11  . Lung cancer Father 44  . Breast cancer Paternal Aunt    Social History   Tobacco Use  . Smoking status: Never Smoker  . Smokeless tobacco: Never Used  Substance Use Topics  . Alcohol use: No  . Drug use: No    Current Outpatient Medications:  .  ibuprofen (ADVIL,MOTRIN) 800 MG tablet, Take 1 tablet (800 mg total) by mouth 3 (three) times daily., Disp: 30 tablet, Rfl: 0 .  naproxen (NAPROSYN) 500 MG tablet, Take 1 tablet (500 mg total) by mouth 2 (two) times daily with a meal., Disp: 60 tablet, Rfl: 0 Allergies: Patient has no known allergies.  Review of Systems  Constitutional: Positive for malaise/fatigue. Negative for chills and fever.   HENT: Negative for congestion, sinus pain and sore throat.   Eyes: Negative for blurred vision and pain.  Respiratory: Negative for cough and wheezing.   Cardiovascular: Negative for chest pain and leg swelling.  Gastrointestinal: Negative for abdominal pain, constipation, diarrhea, heartburn, nausea and vomiting.  Genitourinary: Negative for dysuria, frequency, hematuria and urgency.  Musculoskeletal: Positive for joint pain and myalgias. Negative for back pain and neck pain.  Skin: Negative for itching and rash.  Neurological: Negative for dizziness, tremors and weakness.  Endo/Heme/Allergies: Does not bruise/bleed easily.  Psychiatric/Behavioral: Negative for depression. The patient is not nervous/anxious and does not have insomnia.     Objective: BP 120/80   Temp (!) 96.6 F (35.9 C)   Ht 5' 8"  (1.727 m)   Wt 229 lb (103.9 kg)   BMI 34.82 kg/m   Filed Weights   04/19/19 0941  Weight: 229 lb (103.9 kg)   Body mass index is 34.82 kg/m. Physical Exam Constitutional:      General: She is not in acute distress.    Appearance: She is well-developed.  Genitourinary:     Pelvic exam was performed with patient supine.     Vagina, uterus and rectum normal.     No lesions in the vagina.     No vaginal bleeding.     No cervical motion tenderness, friability, lesion  or polyp.     Uterus is mobile.     Uterus is not enlarged.     No uterine mass detected.    Uterus is midaxial.     No right or left adnexal mass present.     Right adnexa not tender.     Left adnexa not tender.  HENT:     Head: Normocephalic and atraumatic. No laceration.     Right Ear: Hearing normal.     Left Ear: Hearing normal.     Mouth/Throat:     Pharynx: Uvula midline.  Eyes:     Pupils: Pupils are equal, round, and reactive to light.  Neck:     Musculoskeletal: Normal range of motion and neck supple.     Thyroid: No thyromegaly.  Cardiovascular:     Rate and Rhythm: Normal rate and regular  rhythm.     Heart sounds: No murmur. No friction rub. No gallop.   Pulmonary:     Effort: Pulmonary effort is normal. No respiratory distress.     Breath sounds: Normal breath sounds. No wheezing.  Chest:     Breasts:        Right: No mass, skin change or tenderness.        Left: No mass, skin change or tenderness.  Abdominal:     General: Bowel sounds are normal. There is no distension.     Palpations: Abdomen is soft.     Tenderness: There is no abdominal tenderness. There is no rebound.  Musculoskeletal: Normal range of motion.  Neurological:     Mental Status: She is alert and oriented to person, place, and time.     Cranial Nerves: No cranial nerve deficit.  Skin:    General: Skin is warm and dry.  Psychiatric:        Judgment: Judgment normal.  Vitals signs reviewed.     Assessment: Annual Exam 1. Women's annual routine gynecological examination   2. Family history of breast cancer   3. Encounter for screening mammogram for malignant neoplasm of breast   4. Screening for cervical cancer     Plan:            1.  Cervical Screening-  Pap smear done today  2. Breast screening- Exam annually and mammogram scheduled Also counseled as to the recommendations for MRI of breasts based on T-C model score 29%  3. Colonoscopy every 10 years starting age 50, Hemoccult testing after age 34, Cologuard recently normal  4. Labs managed by PCP  5. Counseling for hormonal therapy: none              6. FRAX - FRAX score for assessing the 10 year probability for fracture calculated and discussed today.  Based on age and score today, DEXA is not currently scheduled.    F/U  Return in about 1 year (around 04/18/2020) for Annual.  Barnett Applebaum, MD, [facility], [facility] 04/19/2019  10:10 AM

## 2019-04-19 NOTE — Patient Instructions (Signed)
PAP every three years Mammogram every year    Call [phone] to schedule at [facility] yearly (with PCP)

## 2019-04-20 LAB — CYTOLOGY - PAP
Comment: NEGATIVE
Diagnosis: NEGATIVE
High risk HPV: NEGATIVE

## 2019-05-06 ENCOUNTER — Other Ambulatory Visit: Payer: Self-pay

## 2019-05-06 ENCOUNTER — Ambulatory Visit
Admission: RE | Admit: 2019-05-06 | Discharge: 2019-05-06 | Disposition: A | Discharge status: Home / Self Care | Payer: BC Managed Care – PPO | Source: Ambulatory Visit | Attending: Obstetrics & Gynecology | Admitting: Obstetrics & Gynecology

## 2019-05-06 DIAGNOSIS — Z1231 Encounter for screening mammogram for malignant neoplasm of breast: Secondary | ICD-10-CM | POA: Insufficient documentation

## 2019-05-06 DIAGNOSIS — Z803 Family history of malignant neoplasm of breast: Secondary | ICD-10-CM | POA: Diagnosis not present

## 2019-05-07 ENCOUNTER — Encounter: Payer: Self-pay | Admitting: Obstetrics & Gynecology

## 2019-06-04 ENCOUNTER — Ambulatory Visit: Payer: BC Managed Care – PPO | Attending: Internal Medicine

## 2019-06-04 DIAGNOSIS — Z20822 Contact with and (suspected) exposure to covid-19: Secondary | ICD-10-CM

## 2019-06-06 LAB — NOVEL CORONAVIRUS, NAA: SARS-CoV-2, NAA: NOT DETECTED

## 2019-06-07 ENCOUNTER — Other Ambulatory Visit: Payer: Self-pay

## 2019-06-07 ENCOUNTER — Ambulatory Visit
Admission: RE | Admit: 2019-06-07 | Discharge: 2019-06-07 | Disposition: A | Discharge status: Home / Self Care | Payer: BC Managed Care – PPO | Source: Ambulatory Visit | Attending: Obstetrics & Gynecology | Admitting: Obstetrics & Gynecology

## 2019-06-07 DIAGNOSIS — Z803 Family history of malignant neoplasm of breast: Secondary | ICD-10-CM | POA: Diagnosis present

## 2019-06-07 MED ORDER — GADOBUTROL 1 MMOL/ML IV SOLN
10.0000 mL | Freq: Once | INTRAVENOUS | Status: AC | PRN
  Administered 2019-06-07: 10 mL via INTRAVENOUS

## 2020-04-18 ENCOUNTER — Other Ambulatory Visit: Payer: Self-pay | Admitting: Internal Medicine

## 2020-04-18 DIAGNOSIS — Z1231 Encounter for screening mammogram for malignant neoplasm of breast: Secondary | ICD-10-CM

## 2020-04-19 ENCOUNTER — Encounter: Payer: Self-pay | Admitting: Obstetrics & Gynecology

## 2020-04-19 ENCOUNTER — Other Ambulatory Visit: Payer: Self-pay

## 2020-04-19 ENCOUNTER — Ambulatory Visit (INDEPENDENT_AMBULATORY_CARE_PROVIDER_SITE_OTHER): Payer: BC Managed Care – PPO | Admitting: Obstetrics & Gynecology

## 2020-04-19 VITALS — BP 128/72 | Ht 68.0 in | Wt 240.0 lb

## 2020-04-19 DIAGNOSIS — Z23 Encounter for immunization: Secondary | ICD-10-CM | POA: Diagnosis not present

## 2020-04-19 DIAGNOSIS — Z803 Family history of malignant neoplasm of breast: Secondary | ICD-10-CM

## 2020-04-19 DIAGNOSIS — Z1231 Encounter for screening mammogram for malignant neoplasm of breast: Secondary | ICD-10-CM

## 2020-04-19 DIAGNOSIS — Z01419 Encounter for gynecological examination (general) (routine) without abnormal findings: Secondary | ICD-10-CM

## 2020-04-19 NOTE — Progress Notes (Signed)
HPI:      [name] is a 49 y.o. [phone] who LMP was in the past, she presents today for her annual examination.  The patient has no complaints today other than WEIGHT GAIN this past year and some URINARY URGENCY (no nocturia, frequency, or GSI). The patient is sexually active. Herlast pap: approximate date 2020 and was normal and last mammogram: approximate date 2020 and was normal.  The patient does perform self breast exams.  There is notable family history of breast or ovarian cancer in her family. Pt is BRCA NEG and TC 29%, normal MR Breast last year.  The patient is not taking hormone replacement therapy. Patient denies post-menopausal vaginal bleeding.   The patient has regular exercise: yes. The patient denies current symptoms of depression.    GYN Hx: Last Colonoscopy:not done yet.  Cologurad 2019 Normal.    PMHx: Past Medical History:  Diagnosis Date  . BRCA negative 03/2018   MyRisk neg except GALNT12 VUS  . Family history of breast cancer   . Hypercholesterolemia   . Increased risk of breast cancer 03/2018   IBIS=29.2%   Past Surgical History:  Procedure Laterality Date  . CERVICAL CERCLAGE    . CESAREAN SECTION    . COMBINED HYSTEROSCOPY DIAGNOSTIC / D&C    . ESSURE TUBAL LIGATION     Family History  Problem Relation Age of Onset  . Diabetes Mother   . Hypertension Mother   . Breast cancer Mother 69  . Lung cancer Father 31  . Breast cancer Paternal Aunt    Social History   Tobacco Use  . Smoking status: Never Smoker  . Smokeless tobacco: Never Used  Vaping Use  . Vaping Use: Never used  Substance Use Topics  . Alcohol use: No  . Drug use: No    Current Outpatient Medications:  .  meloxicam (MOBIC) 15 MG tablet, meloxicam 15 mg tablet, Disp: , Rfl:  .  PARoxetine (PAXIL) 10 MG tablet, paroxetine 10 mg tablet, Disp: , Rfl:  .  ibuprofen (ADVIL,MOTRIN) 800 MG tablet, Take 1 tablet (800 mg total) by mouth 3 (three) times daily. (Patient not  taking: Reported on 04/19/2020), Disp: 30 tablet, Rfl: 0 .  naproxen (NAPROSYN) 500 MG tablet, Take 1 tablet (500 mg total) by mouth 2 (two) times daily with a meal. (Patient not taking: Reported on 04/19/2020), Disp: 60 tablet, Rfl: 0 Allergies: Patient has no known allergies.  Review of Systems  Constitutional: Negative for chills, fever and malaise/fatigue.  HENT: Negative for congestion, sinus pain and sore throat.   Eyes: Negative for blurred vision and pain.  Respiratory: Negative for cough and wheezing.   Cardiovascular: Negative for chest pain and leg swelling.  Gastrointestinal: Negative for abdominal pain, constipation, diarrhea, heartburn, nausea and vomiting.  Genitourinary: Negative for dysuria, frequency, hematuria and urgency.  Musculoskeletal: Negative for back pain, joint pain, myalgias and neck pain.  Skin: Negative for itching and rash.  Neurological: Negative for dizziness, tremors and weakness.  Endo/Heme/Allergies: Does not bruise/bleed easily.  Psychiatric/Behavioral: Negative for depression. The patient is not nervous/anxious and does not have insomnia.     Objective: BP 128/72   Ht _0  (1.727 m)   Wt 240 lb (108.9 kg)   BMI 36.49 kg/m   Filed Weights   04/19/20 0805  Weight: 240 lb (108.9 kg)   Body mass index is 36.49 kg/m. Physical Exam Constitutional:      General: She is not in acute distress.  Appearance: She is well-developed.  Genitourinary:     Pelvic exam was performed with patient supine.     Vagina, uterus and rectum normal.     No lesions in the vagina.     No vaginal bleeding.     No cervical motion tenderness, friability, lesion or polyp.     Uterus is mobile.     Uterus is not enlarged.     No uterine mass detected.    Uterus is midaxial.     No right or left adnexal mass present.     Right adnexa not tender.     Left adnexa not tender.  HENT:     Head: Normocephalic and atraumatic. No laceration.     Right Ear: Hearing  normal.     Left Ear: Hearing normal.     Mouth/Throat:     Pharynx: Uvula midline.  Eyes:     Pupils: Pupils are equal, round, and reactive to light.  Neck:     Thyroid: No thyromegaly.  Cardiovascular:     Rate and Rhythm: Normal rate and regular rhythm.     Heart sounds: No murmur heard.  No friction rub. No gallop.   Pulmonary:     Effort: Pulmonary effort is normal. No respiratory distress.     Breath sounds: Normal breath sounds. No wheezing.  Chest:     Breasts:        Right: No mass, skin change or tenderness.        Left: No mass, skin change or tenderness.  Abdominal:     General: Bowel sounds are normal. There is no distension.     Palpations: Abdomen is soft.     Tenderness: There is no abdominal tenderness. There is no rebound.  Musculoskeletal:        General: Normal range of motion.     Cervical back: Normal range of motion and neck supple.  Neurological:     Mental Status: She is alert and oriented to person, place, and time.     Cranial Nerves: No cranial nerve deficit.  Skin:    General: Skin is warm and dry.  Psychiatric:        Judgment: Judgment normal.  Vitals reviewed.     Assessment: Annual Exam 1. Women's annual routine gynecological examination   2. Family history of breast cancer   3. Encounter for screening mammogram for malignant neoplasm of breast     Plan:            1.  Cervical Screening-  Pap smear schedule reviewed with patient  2. Breast screening- Exam annually and mammogram scheduled  3. Colonoscopy every 10 years, Hemoccult testing after age 66  4. Labs managed by PCP  5. Counseling for hormonal therapy: none              6. FRAX - FRAX score for assessing the 10 year probability for fracture calculated and discussed today.  Based on age and score today, DEXA is not currently scheduled.    F/U  Return in about 1 year (around 04/19/2021) for Annual.  Barnett Applebaum, MD, [facility], [facility] 04/19/2020  8:40 AM

## 2020-04-19 NOTE — Patient Instructions (Signed)
PAP every three years Mammogram every year    Call [phone] to schedule at [facility] Colonoscopy every 10 years after age 48 Labs yearly (with PCP)  Thank you for choosing [facility]. As part of our ongoing efforts to improve patient experience, we would appreciate your feedback. Please fill out the short survey that you will receive by mail or MyChart. Your opinion is important to [place]! - Dr. Kenton Kingfisher

## 2020-05-09 ENCOUNTER — Ambulatory Visit
Admission: RE | Admit: 2020-05-09 | Discharge: 2020-05-09 | Disposition: A | Discharge status: Home / Self Care | Payer: BC Managed Care – PPO | Source: Ambulatory Visit | Attending: Internal Medicine | Admitting: Internal Medicine

## 2020-05-09 ENCOUNTER — Other Ambulatory Visit: Payer: Self-pay

## 2020-05-09 DIAGNOSIS — Z1231 Encounter for screening mammogram for malignant neoplasm of breast: Secondary | ICD-10-CM | POA: Diagnosis not present

## 2020-05-16 ENCOUNTER — Other Ambulatory Visit: Payer: Self-pay | Admitting: Internal Medicine

## 2020-05-16 DIAGNOSIS — N6320 Unspecified lump in the left breast, unspecified quadrant: Secondary | ICD-10-CM

## 2020-05-16 DIAGNOSIS — R928 Other abnormal and inconclusive findings on diagnostic imaging of breast: Secondary | ICD-10-CM

## 2020-05-29 ENCOUNTER — Ambulatory Visit: Payer: BC Managed Care – PPO

## 2020-05-29 ENCOUNTER — Other Ambulatory Visit: Payer: BC Managed Care – PPO

## 2020-06-06 ENCOUNTER — Other Ambulatory Visit: Payer: Self-pay

## 2020-06-06 ENCOUNTER — Ambulatory Visit
Admission: RE | Admit: 2020-06-06 | Discharge: 2020-06-06 | Disposition: A | Discharge status: Home / Self Care | Payer: BC Managed Care – PPO | Source: Ambulatory Visit | Attending: Internal Medicine | Admitting: Internal Medicine

## 2020-06-06 DIAGNOSIS — N6320 Unspecified lump in the left breast, unspecified quadrant: Secondary | ICD-10-CM | POA: Insufficient documentation

## 2020-06-06 DIAGNOSIS — R928 Other abnormal and inconclusive findings on diagnostic imaging of breast: Secondary | ICD-10-CM | POA: Insufficient documentation

## 2021-04-04 ENCOUNTER — Ambulatory Visit: Payer: BC Managed Care – PPO | Admitting: Obstetrics & Gynecology

## 2021-04-06 ENCOUNTER — Ambulatory Visit (INDEPENDENT_AMBULATORY_CARE_PROVIDER_SITE_OTHER): Payer: BC Managed Care – PPO | Admitting: Obstetrics & Gynecology

## 2021-04-06 ENCOUNTER — Other Ambulatory Visit: Payer: Self-pay

## 2021-04-06 ENCOUNTER — Encounter: Payer: Self-pay | Admitting: Obstetrics & Gynecology

## 2021-04-06 VITALS — BP 128/80 | Ht 68.0 in | Wt 237.0 lb

## 2021-04-06 DIAGNOSIS — Z01419 Encounter for gynecological examination (general) (routine) without abnormal findings: Secondary | ICD-10-CM

## 2021-04-06 DIAGNOSIS — Z9189 Other specified personal risk factors, not elsewhere classified: Secondary | ICD-10-CM

## 2021-04-06 DIAGNOSIS — Z1231 Encounter for screening mammogram for malignant neoplasm of breast: Secondary | ICD-10-CM

## 2021-04-06 DIAGNOSIS — Z1211 Encounter for screening for malignant neoplasm of colon: Secondary | ICD-10-CM

## 2021-04-06 NOTE — Progress Notes (Signed)
HPI:      [name] is a 50 y.o. [phone] who LMP was No LMP recorded. (Menstrual status: IUD)., she presents today for her annual examination. The patient has no complaints today. The patient is sexually active. Her last pap: approximate date 2020 and was normal and last mammogram: approximate date 2022 and was normal in follow up to an intital abn MMG (cysts).  MRI 2021 normal.  She has BRCA NEG and TC 29%. The patient does perform self breast exams.  There is notable family history of breast or ovarian cancer in her family.  The patient has regular exercise: yes.  The patient denies current symptoms of depression.    GYN History: Contraception: tubal ligation  PMHx: Past Medical History:  Diagnosis Date   BRCA negative 03/2018   MyRisk neg except GALNT12 VUS   Family history of breast cancer    Hypercholesterolemia    Increased risk of breast cancer 03/2018   IBIS=29.2%   Past Surgical History:  Procedure Laterality Date   CERVICAL CERCLAGE     CESAREAN SECTION     COMBINED HYSTEROSCOPY DIAGNOSTIC / D&C     ESSURE TUBAL LIGATION     Family History  Problem Relation Age of Onset   Diabetes Mother    Hypertension Mother    Breast cancer Mother 80   Lung cancer Father 90   Breast cancer Paternal Aunt    Social History   Tobacco Use   Smoking status: Never   Smokeless tobacco: Never  Vaping Use   Vaping Use: Never used  Substance Use Topics   Alcohol use: No   Drug use: No    Current Outpatient Medications:    meloxicam (MOBIC) 15 MG tablet, meloxicam 15 mg tablet, Disp: , Rfl:    PARoxetine (PAXIL) 10 MG tablet, paroxetine 10 mg tablet, Disp: , Rfl:    simvastatin (ZOCOR) 10 MG tablet, simvastatin 10 mg tablet, Disp: , Rfl:  Allergies: Patient has no known allergies.  Review of Systems  Constitutional:  Positive for malaise/fatigue. Negative for chills and fever.  HENT:  Negative for congestion, sinus pain and sore throat.   Eyes:  Negative for  blurred vision and pain.  Respiratory:  Negative for cough and wheezing.   Cardiovascular:  Negative for chest pain and leg swelling.  Gastrointestinal:  Negative for abdominal pain, constipation, diarrhea, heartburn, nausea and vomiting.  Genitourinary:  Positive for frequency. Negative for dysuria, hematuria and urgency.  Musculoskeletal:  Negative for back pain, joint pain, myalgias and neck pain.  Skin:  Negative for itching and rash.  Neurological:  Positive for weakness. Negative for dizziness and tremors.  Endo/Heme/Allergies:  Does not bruise/bleed easily.  Psychiatric/Behavioral:  Negative for depression. The patient is not nervous/anxious and does not have insomnia.    Objective: BP 128/80   Ht _0  (1.727 m)   Wt 237 lb (107.5 kg)   BMI 36.04 kg/m   Filed Weights   04/06/21 0913  Weight: 237 lb (107.5 kg)   Body mass index is 36.04 kg/m. Physical Exam Constitutional:      General: She is not in acute distress.    Appearance: She is well-developed.  Genitourinary:     Bladder, rectum and urethral meatus normal.     No lesions in the vagina.     Right Labia: No rash, tenderness or lesions.    Left Labia: No tenderness, lesions or rash.    No vaginal bleeding.  Right Adnexa: not tender and no mass present.    Left Adnexa: not tender and no mass present.    No cervical motion tenderness, friability, lesion or polyp.     Uterus is not enlarged.     No uterine mass detected.    Uterus is anteverted.     Pelvic exam was performed with patient in the lithotomy position.  Breasts:    Right: No mass, skin change or tenderness.     Left: No mass, skin change or tenderness.  HENT:     Head: Normocephalic and atraumatic. No laceration.     Right Ear: Hearing normal.     Left Ear: Hearing normal.     Mouth/Throat:     Pharynx: Uvula midline.  Eyes:     Pupils: Pupils are equal, round, and reactive to light.  Neck:     Thyroid: No thyromegaly.  Cardiovascular:      Rate and Rhythm: Normal rate and regular rhythm.     Heart sounds: No murmur heard.   No friction rub. No gallop.  Pulmonary:     Effort: Pulmonary effort is normal. No respiratory distress.     Breath sounds: Normal breath sounds. No wheezing.  Abdominal:     General: Bowel sounds are normal. There is no distension.     Palpations: Abdomen is soft.     Tenderness: There is no abdominal tenderness. There is no rebound.  Musculoskeletal:        General: Normal range of motion.     Cervical back: Normal range of motion and neck supple.  Neurological:     Mental Status: She is alert and oriented to person, place, and time.     Cranial Nerves: No cranial nerve deficit.  Skin:    General: Skin is warm and dry.  Psychiatric:        Judgment: Judgment normal.  Vitals reviewed.    Assessment:  ANNUAL EXAM 1. Women's annual routine gynecological examination   2. Encounter for screening mammogram for malignant neoplasm of breast   3. At high risk for breast cancer   4. Screen for colon cancer      Screening Plan:            1.  Cervical Screening-  Pap smear schedule reviewed with patient, Pap smear to be scheduled, due 2023  2. Breast screening- Exam annually and mammogram>40 planned - Plan MMG and MRI this year  3. Colonoscopy every 10 years, Hemoccult testing - after age 77. To be scheduled soon  4. Labs managed by PCP  5. Counseling for contraception: bilateral tubal ligation  The pregnancy intention screening data noted above was reviewed. Potential methods of contraception were discussed. The patient elected to proceed with Female Sterilization.       F/U  Return in about 1 year (around 04/06/2022) for Annual.  Barnett Applebaum, MD, [facility], [facility] 04/06/2021  10:02 AM

## 2021-04-06 NOTE — Patient Instructions (Signed)
PAP every three years Mammogram every year    Call [phone] to schedule at [facility] Colonoscopy every 10 years Labs yearly (with PCP)  Thank you for choosing [facility]. As part of our ongoing efforts to improve patient experience, we would appreciate your feedback. Please fill out the short survey that you will receive by mail or MyChart. Your opinion is important to [place]! - Dr. Kenton Kingfisher  Recommendations to boost your immunity to prevent illness such as viral flu and colds, including covid19, are as follows:       - - -  Vitamin K2 and Vitamin D3  - - - Take Vitamin K2 at 200-300 mcg daily (usually 2-3 pills daily of the over the counter formulation). Take Vitamin D3 at 3000-4000 U daily (usually 3-4 pills daily of the over the counter formulation). Studies show that these two at high normal levels in your system are very effective in keeping your immunity so strong and protective that you will be unlikely to contract viral illness such as those listed above.  Dr Kenton Kingfisher

## 2021-05-08 IMAGING — MG MM DIGITAL DIAGNOSTIC UNILAT*L* W/ TOMO W/ CAD
4 series · 4 of 12 positions shown · non-contrast
Comparison: Previous exam(s).

CLINICAL DATA: Possible masses in the outer left breast on a recent
screening mammogram.

EXAM:
DIGITAL DIAGNOSTIC LEFT MAMMOGRAM WITH CAD AND TOMO
ULTRASOUND LEFT BREAST

[L ML synth-2D]
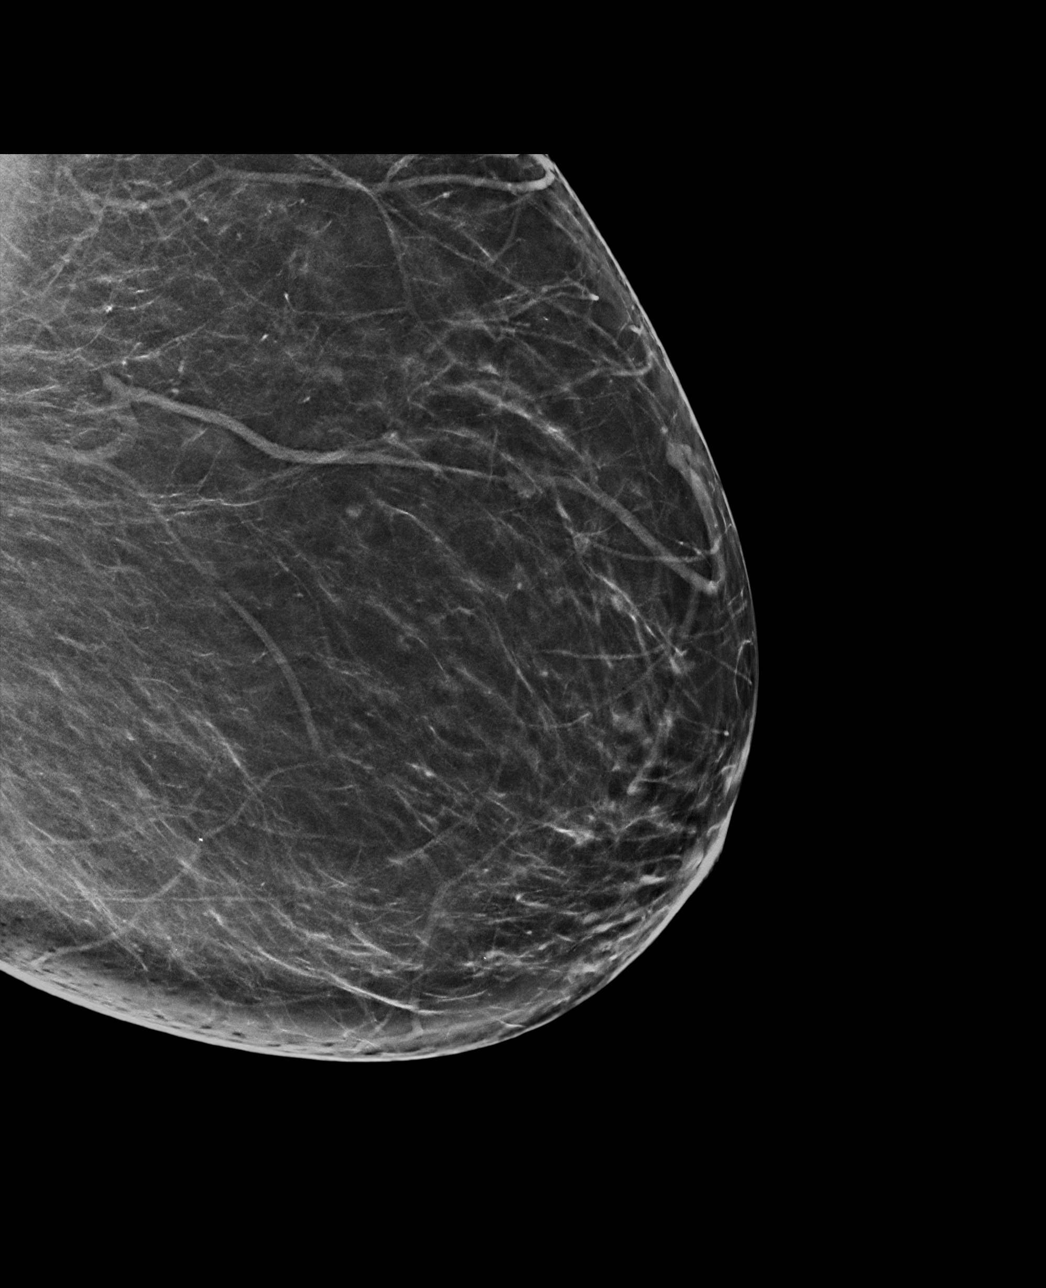

[L CC synth-2D]
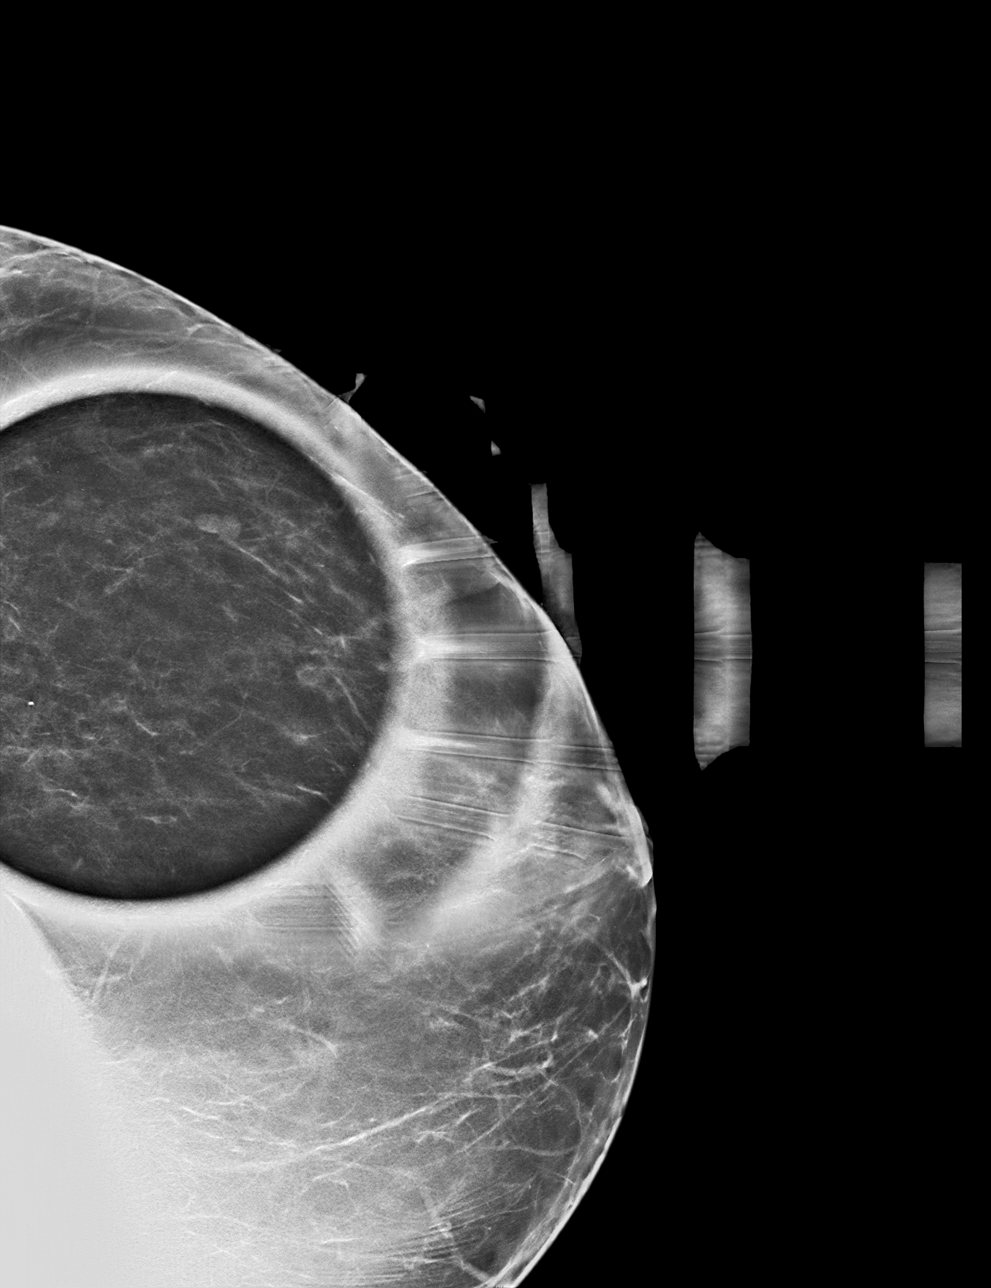

[L CC tomo · tomo slice 31/61.0]
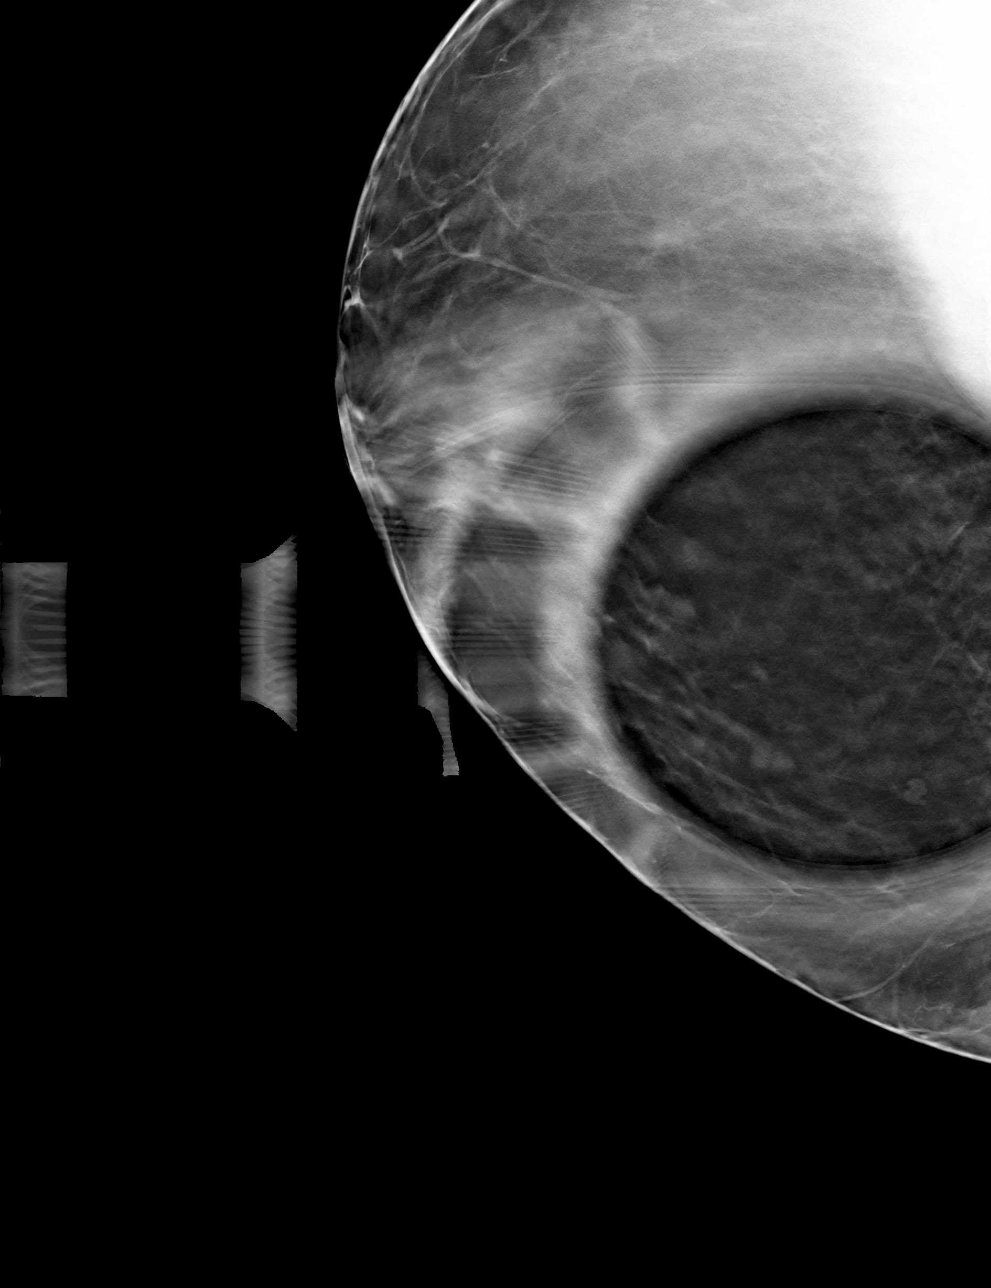

[L ML tomo · tomo slice 39/78.0]
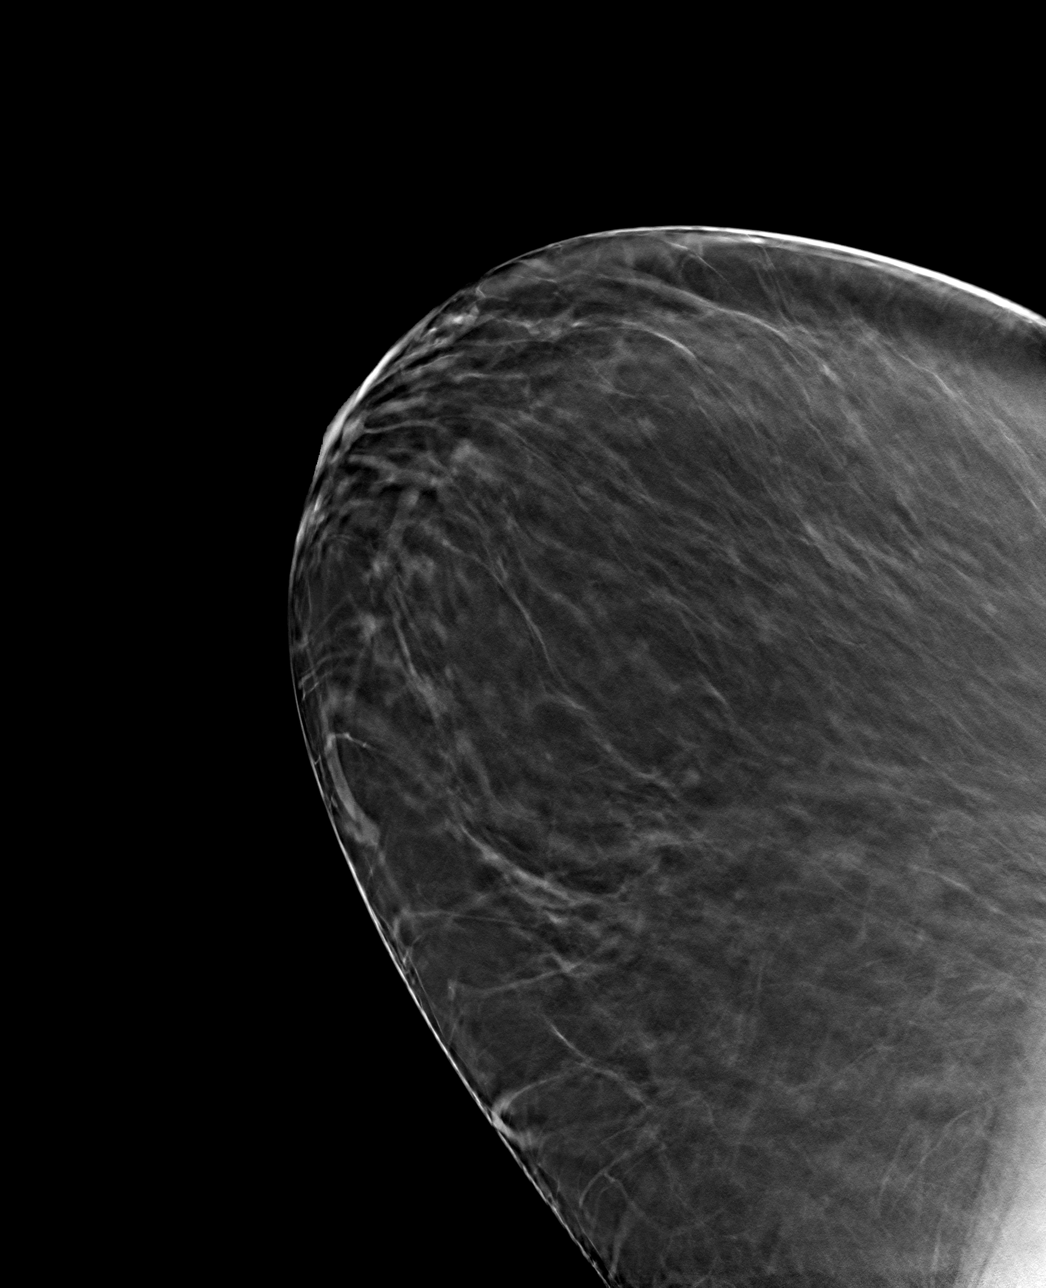

[4 of 12 positions shown; findings below may reference images not displayed]

ACR Breast Density Category b: There are scattered areas of
fibroglandular density.
FINDINGS: 3D tomographic and 2D generated true lateral and spot compression
cranial caudal images of the left breast were obtained. These
confirm multiple small, oval and bilobed, circumscribed masses in
the outer left breast.

Mammographic images were processed with CAD.

Targeted ultrasound is performed, showing an 8 mm bilobed cyst
containing a mildly thickened internal septation without nodularity
or internal blood flow with color Doppler in the 1:30 o'clock
position of the left breast, 4 cm from the nipple. This corresponds
to one of the bilobed masses seen at ultrasound. There is also a 4
mm oval, hypoechoic cyst without internal blood flow in the 2
o'clock position of the left breast, 3 cm from the nipple,
corresponding to another mass. Multiple additional smaller cysts
were also demonstrated in that region of the breast with no solid
masses seen.
IMPRESSION: Benign left breast cysts.  No evidence of malignancy.

RECOMMENDATION:
Bilateral screening mammogram in 11 months when due.

I have discussed the findings and recommendations with the patient.
If applicable, a reminder letter will be sent to the patient
regarding the next appointment.

BI-RADS CATEGORY  2: Benign.

## 2021-05-08 IMAGING — US US BREAST*L* LIMITED INC AXILLA
1 series · 10 of 10 positions shown · non-contrast
Comparison: Previous exam(s).

CLINICAL DATA: Possible masses in the outer left breast on a recent
screening mammogram.

EXAM:
DIGITAL DIAGNOSTIC LEFT MAMMOGRAM WITH CAD AND TOMO
ULTRASOUND LEFT BREAST

[Series 1: us breast*left* limited inc axilla · 0.04mm/px · 10 of 10 slices shown]
[im 1/10]
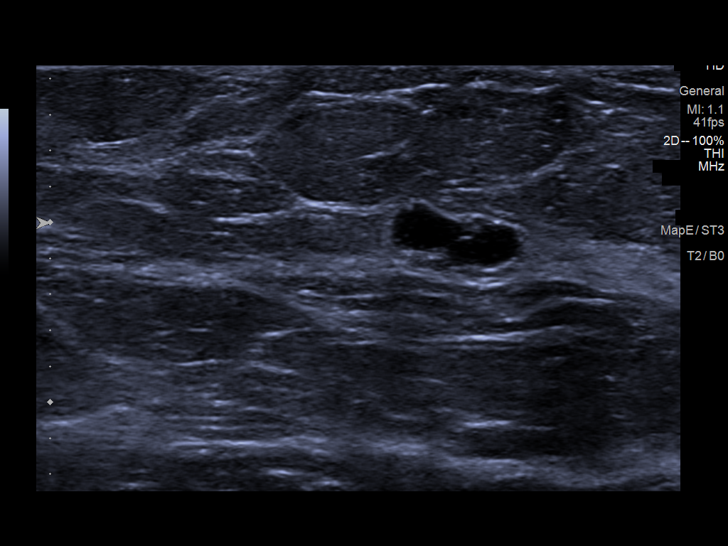
[im 2/10]
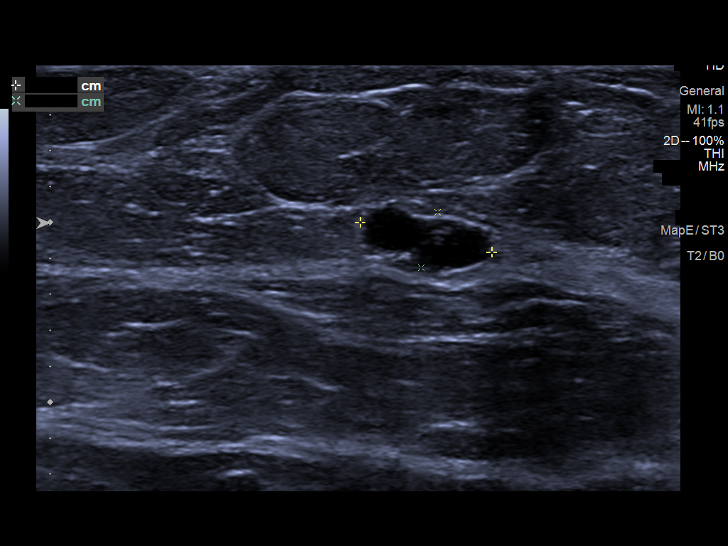
[im 3/10]
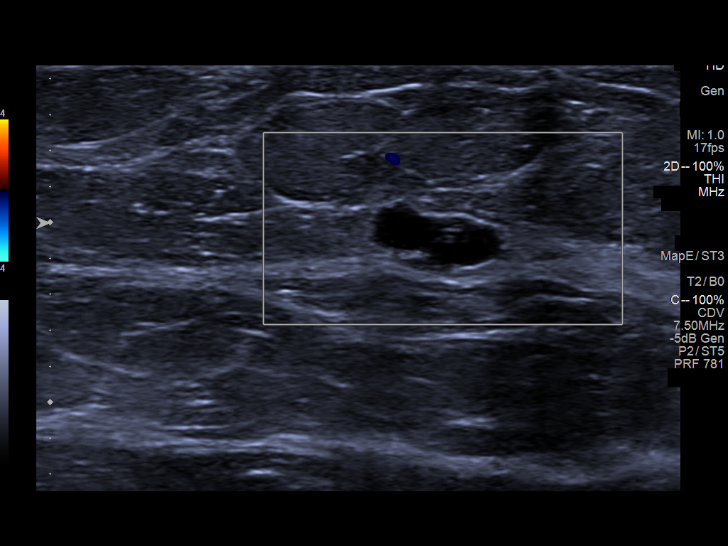
[im 4/10]
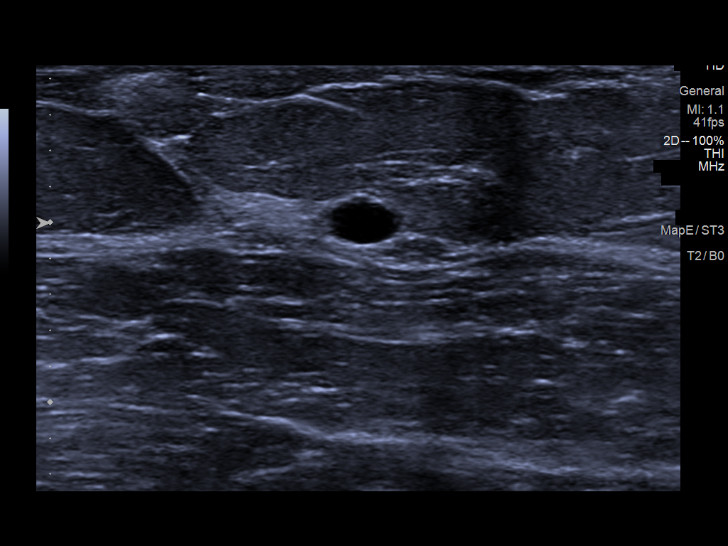
[im 5/10]
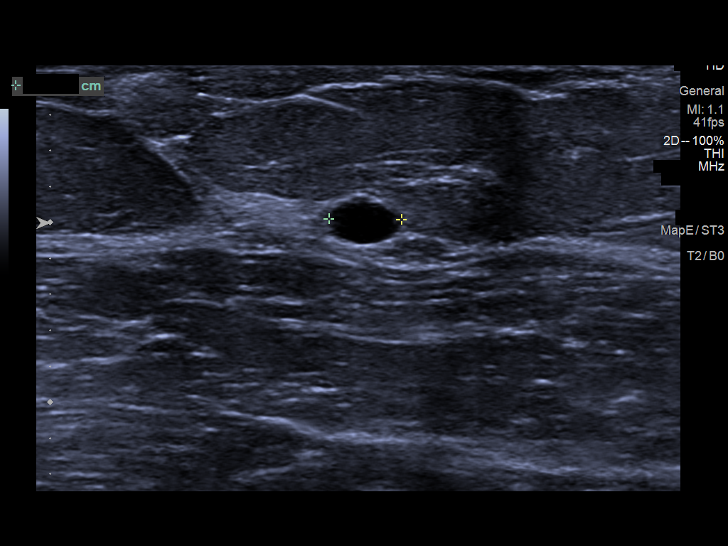
[im 6/10]
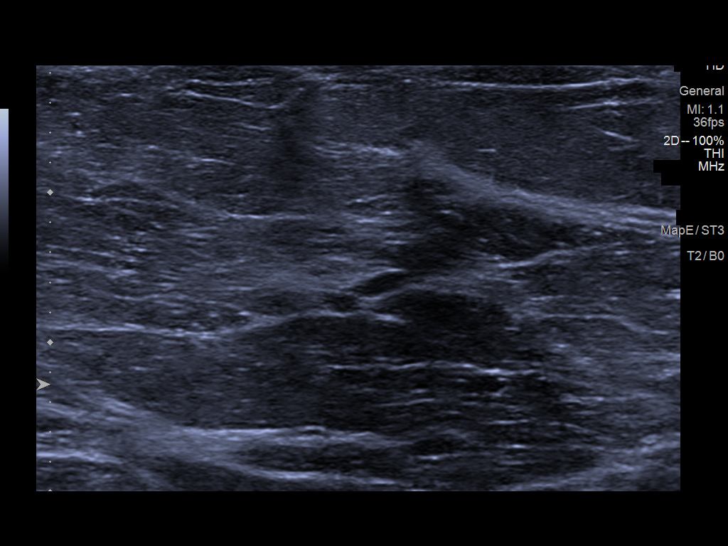
[im 7/10]
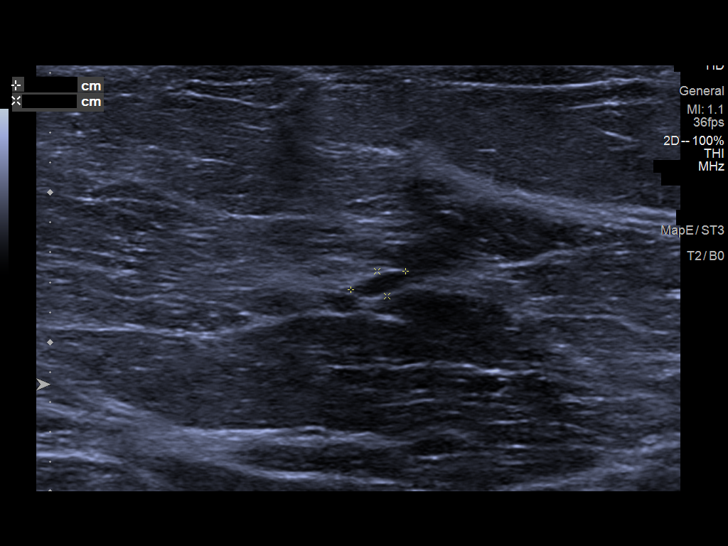
[im 8/10]
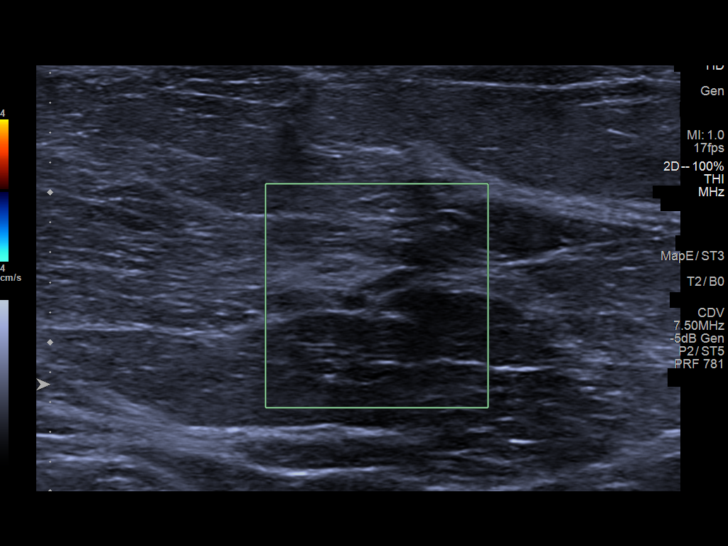
[im 9/10]
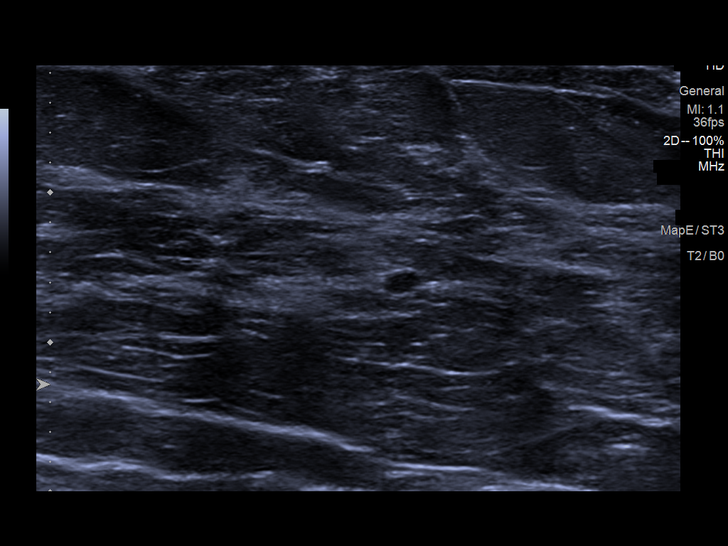
[im 10/10]
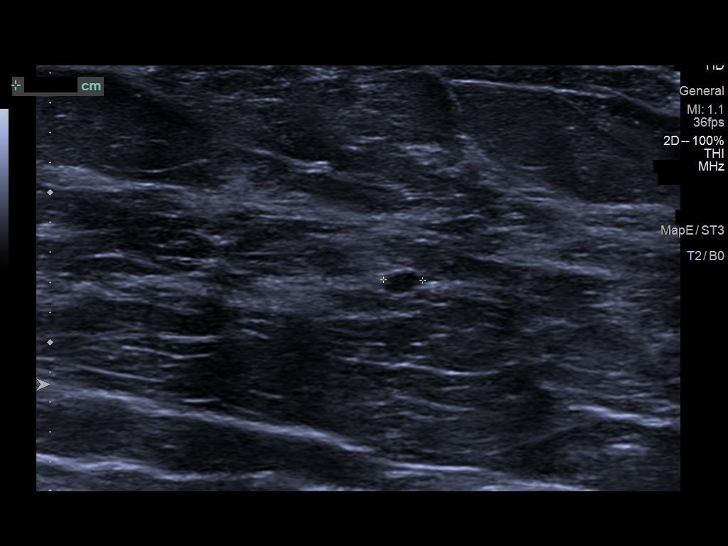

[10 of 10 positions shown; findings below may reference images not displayed]

ACR Breast Density Category b: There are scattered areas of
fibroglandular density.
FINDINGS: 3D tomographic and 2D generated true lateral and spot compression
cranial caudal images of the left breast were obtained. These
confirm multiple small, oval and bilobed, circumscribed masses in
the outer left breast.

Mammographic images were processed with CAD.

Targeted ultrasound is performed, showing an 8 mm bilobed cyst
containing a mildly thickened internal septation without nodularity
or internal blood flow with color Doppler in the 1:30 o'clock
position of the left breast, 4 cm from the nipple. This corresponds
to one of the bilobed masses seen at ultrasound. There is also a 4
mm oval, hypoechoic cyst without internal blood flow in the 2
o'clock position of the left breast, 3 cm from the nipple,
corresponding to another mass. Multiple additional smaller cysts
were also demonstrated in that region of the breast with no solid
masses seen.
IMPRESSION: Benign left breast cysts.  No evidence of malignancy.

RECOMMENDATION:
Bilateral screening mammogram in 11 months when due.

I have discussed the findings and recommendations with the patient.
If applicable, a reminder letter will be sent to the patient
regarding the next appointment.

BI-RADS CATEGORY  2: Benign.

## 2021-07-05 ENCOUNTER — Ambulatory Visit
Admission: RE | Admit: 2021-07-05 | Discharge: 2021-07-05 | Disposition: A | Discharge status: Home / Self Care | Payer: 59 | Source: Ambulatory Visit | Attending: Obstetrics & Gynecology | Admitting: Obstetrics & Gynecology

## 2021-07-05 ENCOUNTER — Other Ambulatory Visit: Payer: Self-pay

## 2021-07-05 DIAGNOSIS — Z1231 Encounter for screening mammogram for malignant neoplasm of breast: Secondary | ICD-10-CM | POA: Diagnosis present

## 2021-08-01 ENCOUNTER — Telehealth: Payer: Self-pay | Admitting: Gastroenterology

## 2021-08-01 NOTE — Telephone Encounter (Signed)
Pt called to sched colonoscopy  ?

## 2021-08-02 ENCOUNTER — Other Ambulatory Visit: Payer: Self-pay

## 2021-08-02 DIAGNOSIS — Z1211 Encounter for screening for malignant neoplasm of colon: Secondary | ICD-10-CM

## 2021-08-02 MED ORDER — PEG 3350-KCL-NA BICARB-NACL 420 G PO SOLR: 4000.0000 mL | Freq: Once | ORAL | 0 refills | Status: AC

## 2021-08-02 NOTE — Progress Notes (Signed)
Gastroenterology Pre-Procedure Review ? ?Request Date: 09/21/2021 ?Requesting Physician: Dr. Allen Norris ? ?PATIENT REVIEW QUESTIONS: The patient responded to the following health history questions as indicated:   ? ?1. Are you having any GI issues? no ?2. Do you have a personal history of Polyps? no ?3. Do you have a family history of Colon Cancer or Polyps? yes (uncle colon cancer) ?4. Diabetes Mellitus? no ?5. Joint replacements in the past 12 months?no ?6. Major health problems in the past 3 months?no ?7. Any artificial heart valves, MVP, or defibrillator?no ?   ?MEDICATIONS & ALLERGIES:    ?Patient reports the following regarding taking any anticoagulation/antiplatelet therapy:   ?Plavix, Coumadin, Eliquis, Xarelto, Lovenox, Pradaxa, Brilinta, or Effient? no ?Aspirin? no ? ?Patient confirms/reports the following medications:  ?Current Outpatient Medications  ?Medication Sig Dispense Refill  ? simvastatin (ZOCOR) 10 MG tablet simvastatin 10 mg tablet    ? ?No current facility-administered medications for this visit.  ? ? ?Patient confirms/reports the following allergies:  ?No Known Allergies ? ?No orders of the defined types were placed in this encounter. ? ? ?AUTHORIZATION INFORMATION ?Primary Insurance: ?1D#: ?Group #: ? ?Secondary Insurance: ?1D#: ?Group #: ? ?SCHEDULE INFORMATION: ?Date: 09/21/2021 ?Time: ?Location:[facility] ? ?

## 2021-09-10 ENCOUNTER — Other Ambulatory Visit: Payer: Self-pay

## 2021-09-10 ENCOUNTER — Encounter: Payer: Self-pay | Admitting: Gastroenterology

## 2021-09-21 ENCOUNTER — Ambulatory Visit
Admission: RE | Admit: 2021-09-21 | Discharge: 2021-09-21 | Disposition: A | Discharge status: Home / Self Care | Payer: 59 | Attending: Gastroenterology | Admitting: Gastroenterology

## 2021-09-21 ENCOUNTER — Encounter: Payer: Self-pay | Admitting: Gastroenterology

## 2021-09-21 ENCOUNTER — Ambulatory Visit: Payer: 59 | Admitting: Anesthesiology

## 2021-09-21 ENCOUNTER — Other Ambulatory Visit: Payer: Self-pay

## 2021-09-21 ENCOUNTER — Encounter
Admission: RE | Disposition: A | Discharge status: Home / Self Care | Payer: Self-pay | Source: Home / Self Care | Attending: Gastroenterology

## 2021-09-21 DIAGNOSIS — E669 Obesity, unspecified: Secondary | ICD-10-CM | POA: Insufficient documentation

## 2021-09-21 DIAGNOSIS — Z1211 Encounter for screening for malignant neoplasm of colon: Secondary | ICD-10-CM

## 2021-09-21 DIAGNOSIS — Z6836 Body mass index (BMI) 36.0-36.9, adult: Secondary | ICD-10-CM | POA: Insufficient documentation

## 2021-09-21 HISTORY — DX: Cardiac arrhythmia, unspecified: I49.9

## 2021-09-21 HISTORY — PX: COLONOSCOPY WITH PROPOFOL: SHX5780

## 2021-09-21 LAB — POCT PREGNANCY, URINE: Preg Test, Ur: NEGATIVE

## 2021-09-21 SURGERY — COLONOSCOPY WITH PROPOFOL
Anesthesia: General | Site: Rectum

## 2021-09-21 MED ORDER — PROPOFOL 10 MG/ML IV BOLUS
INTRAVENOUS | Status: DC | PRN
  Administered 2021-09-21 (×2): 50 mg via INTRAVENOUS
  Administered 2021-09-21 (×3): 20 mg via INTRAVENOUS
  Administered 2021-09-21 (×2): 50 mg via INTRAVENOUS

## 2021-09-21 MED ORDER — LACTATED RINGERS IV SOLN: INTRAVENOUS | Status: DC

## 2021-09-21 MED ORDER — ACETAMINOPHEN 160 MG/5ML PO SOLN: 325.0000 mg | ORAL | Status: DC | PRN

## 2021-09-21 MED ORDER — SODIUM CHLORIDE 0.9 % IV SOLN: INTRAVENOUS | Status: DC

## 2021-09-21 MED ORDER — ACETAMINOPHEN 325 MG PO TABS: 325.0000 mg | ORAL_TABLET | ORAL | Status: DC | PRN

## 2021-09-21 MED ORDER — STERILE WATER FOR IRRIGATION IR SOLN
Status: DC | PRN
  Administered 2021-09-21: 60 mL

## 2021-09-21 MED ORDER — LIDOCAINE HCL (CARDIAC) PF 100 MG/5ML IV SOSY
PREFILLED_SYRINGE | INTRAVENOUS | Status: DC | PRN
Start: 2021-09-21 — End: 2021-09-21
  Administered 2021-09-21: 30 mg via INTRAVENOUS

## 2021-09-21 MED ORDER — ONDANSETRON HCL 4 MG/2ML IJ SOLN: 4.0000 mg | Freq: Once | INTRAMUSCULAR | Status: DC | PRN

## 2021-09-21 MED ORDER — STERILE WATER FOR IRRIGATION IR SOLN
Status: DC | PRN
  Administered 2021-09-21: 150 mL

## 2021-09-21 SURGICAL SUPPLY — 22 items

## 2021-09-21 NOTE — Transfer of Care (Signed)
Immediate Anesthesia Transfer of Care Note ? ?Patient: Amy Buckley ? ?Procedure(s) Performed: COLONOSCOPY WITH PROPOFOL (Rectum) ? ?Patient Location: PACU ? ?Anesthesia Type: General ? ?Level of Consciousness: awake, alert  and patient cooperative ? ?Airway and Oxygen Therapy: Patient Spontanous Breathing and Patient connected to supplemental oxygen ? ?Post-op Assessment: Post-op Vital signs reviewed, Patient's Cardiovascular Status Stable, Respiratory Function Stable, Patent Airway and No signs of Nausea or vomiting ? ?Post-op Vital Signs: Reviewed and stable ? ?Complications: No notable events documented. ? ?

## 2021-09-21 NOTE — Anesthesia Preprocedure Evaluation (Addendum)
Anesthesia Evaluation  ?Patient identified by MRN, date of birth, ID band ?Patient awake ? ? ? ?Reviewed: ?Allergy & Precautions, NPO status  ? ?Airway ?Mallampati: II ? ?TM Distance: >3 FB ? ? ? ? Dental ?  ?Pulmonary ? ?  ?Pulmonary exam normal ? ? ? ? ? ? ? Cardiovascular ?+ dysrhythmias (sinus arrhythmia -  no meds)  ?Rhythm:Regular Rate:Normal ? ?HLD ?  ?Neuro/Psych ?  ? GI/Hepatic ?  ?Endo/Other  ?Obesity - BMI > 30 ? Renal/GU ?  ? ?  ?Musculoskeletal ? ? Abdominal ?  ?Peds ? Hematology ?  ?Anesthesia Other Findings ? ? Reproductive/Obstetrics ? ?  ? ? ? ? ? ? ? ? ? ? ? ? ? ?  ?  ? ? ? ? ? ? ? ?Anesthesia Physical ?Anesthesia Plan ? ?ASA: 2 ? ?Anesthesia Plan: General  ? ?Post-op Pain Management:   ? ?Induction: Intravenous ? ?PONV Risk Score and Plan: Propofol infusion, TIVA and Treatment may vary due to age or medical condition ? ?Airway Management Planned: Natural Airway and Nasal Cannula ? ?Additional Equipment:  ? ?Intra-op Plan:  ? ?Post-operative Plan:  ? ?Informed Consent: I have reviewed the patients History and Physical, chart, labs and discussed the procedure including the risks, benefits and alternatives for the proposed anesthesia with the patient or authorized representative who has indicated his/her understanding and acceptance.  ? ? ? ? ? ?Plan Discussed with: CRNA ? ?Anesthesia Plan Comments:   ? ? ? ? ? ? ?Anesthesia Quick Evaluation ? ?

## 2021-09-21 NOTE — H&P (Signed)
? ?  Darren Wohl, MD FACG ?[address] ?[place], [place] [address] ?Phone: [phone] ?Fax : [phone] ? ?Primary Care Physician:  Khan, Neelam S, MD ?Primary Gastroenterologist:  Dr. Wohl ? ?Pre-Procedure History & Physical: ?HPI:  Amy Buckley is a 51 y.o. female is here for a screening colonoscopy.  ? ?Past Medical History:  ?Diagnosis Date  ? BRCA negative 03/2018  ? MyRisk neg except GALNT12 VUS  ? Dysrhythmia   ? Family history of breast cancer   ? Hypercholesterolemia   ? Increased risk of breast cancer 03/2018  ? IBIS=29.2%  ? ? ?Past Surgical History:  ?Procedure Laterality Date  ? CERVICAL CERCLAGE    ? CESAREAN SECTION    ? COMBINED HYSTEROSCOPY DIAGNOSTIC / D&C    ? ESSURE TUBAL LIGATION    ? ? ?Prior to Admission medications   ?Medication Sig Start Date End Date Taking? Authorizing Provider  ?simvastatin (ZOCOR) 10 MG tablet simvastatin 10 mg tablet   Yes [provider]  ? ? ?Allergies as of 08/02/2021  ? (No Known Allergies)  ? ? ?Family History  ?Problem Relation Age of Onset  ? Diabetes Mother   ? Hypertension Mother   ? Breast cancer Mother 40  ? Lung cancer Father 60  ? Breast cancer Paternal Aunt   ? ? ?Social History  ? ?Socioeconomic History  ? Marital status: Married  ?  Spouse name: Not on file  ? Number of children: Not on file  ? Years of education: Not on file  ? Highest education level: Not on file  ?Occupational History  ? Not on file  ?Tobacco Use  ? Smoking status: Never  ? Smokeless tobacco: Never  ?Vaping Use  ? Vaping Use: Never used  ?Substance and Sexual Activity  ? Alcohol use: No  ? Drug use: No  ? Sexual activity: Yes  ?  Birth control/protection: I.U.D.  ?Other Topics Concern  ? Not on file  ?Social History Narrative  ? Not on file  ? ?Social Determinants of Health  ? ?Financial Resource Strain: Not on file  ?Food Insecurity: Not on file  ?Transportation Needs: Not on file  ?Physical Activity: Not on file  ?Stress: Not on file  ?Social Connections:  Not on file  ?Intimate Partner Violence: Not on file  ? ? ?Review of Systems: ?See HPI, otherwise negative ROS ? ?Physical Exam: ?BP 124/83   Pulse 68   Temp (!) 97.5 ?F (36.4 ?C) (Temporal)   Resp 20   Ht 5' 8" (1.727 m)   Wt 108.9 kg   SpO2 99%   BMI 36.49 kg/m?  ?General:   Alert,  pleasant and cooperative in NAD ?Head:  Normocephalic and atraumatic. ?Neck:  Supple; no masses or thyromegaly. ?Lungs:  Clear throughout to auscultation.    ?Heart:  Regular rate and rhythm. ?Abdomen:  Soft, nontender and nondistended. Normal bowel sounds, without guarding, and without rebound.   ?Neurologic:  Alert and  oriented x4;  grossly normal neurologically. ? ?Impression/Plan: ?Amy Buckley is now here to undergo a screening colonoscopy. ? ?Risks, benefits, and alternatives regarding colonoscopy have been reviewed with the patient.  Questions have been answered.  All parties agreeable. ?

## 2021-09-21 NOTE — Op Note (Signed)
[facility] ?Gastroenterology ?Patient Name: Amy Buckley ?Procedure Date: 09/21/2021 7:32 AM ?MRN: [id number] ?Account #: [id number] ?Date of Birth: [birth year] ?Admit Type: Outpatient ?Age: 51 ?Room: [facility] OR ROOM 01 ?Gender: Female ?Note Status: Finalized ?Instrument Name: [id number] ?Procedure:             Colonoscopy ?Indications:           Screening for colorectal malignant neoplasm ?Providers:             Lucilla Lame MD, MD ?Referring MD:          Perrin Maltese, MD (Referring MD) ?Medicines:             Propofol per Anesthesia ?Complications:         No immediate complications. ?Procedure:             Pre-Anesthesia Assessment: ?                       - Prior to the procedure, a History and Physical was  ?                       performed, and patient medications and allergies were  ?                       reviewed. The patient's tolerance of previous  ?                       anesthesia was also reviewed. The risks and benefits  ?                       of the procedure and the sedation options and risks  ?                       were discussed with the patient. All questions were  ?                       answered, and informed consent was obtained. Prior  ?                       Anticoagulants: The patient has taken no previous  ?                       anticoagulant or antiplatelet agents. ASA Grade  ?                       Assessment: II - A patient with mild systemic disease.  ?                       After reviewing the risks and benefits, the patient  ?                       was deemed in satisfactory condition to undergo the  ?                       procedure. ?                       After obtaining informed consent, the colonoscope was  ?  passed under direct vision. Throughout the procedure,  ?                       the patient's blood pressure, pulse, and oxygen  ?                       saturations were monitored continuously. The  ?                       Colonoscope  was introduced through the anus and  ?                       advanced to the the cecum, identified by appendiceal  ?                       orifice and ileocecal valve. The colonoscopy was  ?                       performed without difficulty. The patient tolerated  ?                       the procedure well. The quality of the bowel  ?                       preparation was excellent. ?Findings: ?     The perianal and digital rectal examinations were normal. ?     The colon (entire examined portion) appeared normal. ?Impression:            - The entire examined colon is normal. ?                       - No specimens collected. ?Recommendation:        - Discharge patient to home. ?                       - Resume previous diet. ?                       - Continue present medications. ?                       - Repeat colonoscopy in 10 years for screening  ?                       purposes. ?Procedure Code(s):     --- Professional --- ?                       [phone], Colonoscopy, flexible; diagnostic, including  ?                       collection of specimen(s) by brushing or washing, when  ?                       performed (separate procedure) ?Diagnosis Code(s):     --- Professional --- ?                       Z12.11, Encounter for screening for malignant neoplasm  ?  of colon ?CPT copyright 2019 American Medical Association. All rights reserved. ?The codes documented in this report are preliminary and upon coder review may  ?be revised to meet current compliance requirements. ?Lucilla Lame MD, MD ?09/21/2021 7:52:23 AM ?This report has been signed electronically. ?Number of Addenda: 0 ?Note Initiated On: 09/21/2021 7:32 AM ?Scope Withdrawal Time: 0 hours 6 minutes 57 seconds  ?Total Procedure Duration: 0 hours 8 minutes 30 seconds  ?Estimated Blood Loss:  Estimated blood loss: none. ?     [facility] ?

## 2021-09-21 NOTE — Anesthesia Postprocedure Evaluation (Signed)
Anesthesia Post Note ? ?Patient: Amy Buckley ? ?Procedure(s) Performed: COLONOSCOPY WITH PROPOFOL (Rectum) ? ? ?  ?Patient location during evaluation: PACU ?Anesthesia Type: General ?Level of consciousness: awake ?Pain management: pain level controlled ?Vital Signs Assessment: post-procedure vital signs reviewed and stable ?Respiratory status: respiratory function stable ?Cardiovascular status: stable ?Postop Assessment: no signs of nausea or vomiting ?Anesthetic complications: no ? ? ?No notable events documented. ? ?Veda Canning ? ? ? ? ? ?

## 2021-09-21 NOTE — Anesthesia Procedure Notes (Signed)
Procedure Name: [place] ?Date/Time: 09/21/2021 7:43 AM ?Performed by: Georga Bora, CRNA ?Pre-anesthesia Checklist: Patient identified, Emergency Drugs available, Suction available, Patient being monitored and Timeout performed ?Patient Re-evaluated:Patient Re-evaluated prior to induction ?Oxygen Delivery Method: Nasal cannula ?Placement Confirmation: positive ETCO2 and breath sounds checked- equal and bilateral ? ? ? ? ?

## 2021-10-10 ENCOUNTER — Other Ambulatory Visit: Payer: Self-pay | Admitting: Obstetrics and Gynecology

## 2021-10-10 DIAGNOSIS — Z1239 Encounter for other screening for malignant neoplasm of breast: Secondary | ICD-10-CM

## 2021-10-10 DIAGNOSIS — Z9189 Other specified personal risk factors, not elsewhere classified: Secondary | ICD-10-CM

## 2022-01-03 ENCOUNTER — Ambulatory Visit
Admission: RE | Admit: 2022-01-03 | Discharge: 2022-01-03 | Disposition: A | Discharge status: Home / Self Care | Payer: 59 | Source: Ambulatory Visit | Attending: Obstetrics and Gynecology | Admitting: Obstetrics and Gynecology

## 2022-01-03 DIAGNOSIS — Z1239 Encounter for other screening for malignant neoplasm of breast: Secondary | ICD-10-CM | POA: Diagnosis present

## 2022-01-03 DIAGNOSIS — Z9189 Other specified personal risk factors, not elsewhere classified: Secondary | ICD-10-CM | POA: Insufficient documentation

## 2022-01-03 MED ORDER — GADOBUTROL 1 MMOL/ML IV SOLN
10.0000 mL | Freq: Once | INTRAVENOUS | Status: AC | PRN
  Administered 2022-01-03: 10 mL via INTRAVENOUS

## 2022-05-22 DIAGNOSIS — Z9189 Other specified personal risk factors, not elsewhere classified: Secondary | ICD-10-CM | POA: Insufficient documentation

## 2022-05-22 NOTE — Progress Notes (Unsigned)
PCP: Margaretann Loveless, MD   No chief complaint on file.   HPI:      [name] is a 51 y.o. [phone] whose LMP was No LMP recorded. (Menstrual status: IUD)., presents today for her annual examination.  Her menses are {norm/abn:715}, lasting {number: 22536} days.  Dysmenorrhea {dysmen:716}. She {does:18564} have intermenstrual bleeding. She {does:18564} have vasomotor sx.   Sex activity: {sex active: 315163}. She {does:18564} have vaginal dryness.  Last Pap: 04/19/19 Results were: no abnormalities /neg HPV DNA.  Hx of STDs: {STD hx:14358}  Last mammogram: 07/05/21 Results were: normal--routine follow-up in 12 months.  There is no FH of breast cancer. There is no FH of ovarian cancer. The patient {does:18564} do self-breast exams. Ptis MyRisk neg except ???? VUS 2019; IBIS=29.2% Neg breast MRI 8/23  Colonoscopy: 4/23 with Dr. Servando Snare;  Repeat due after 10*** years.   Tobacco use: {tob:20664} Alcohol use: {Alcohol:11675} No drug use Exercise: {exercise:31265}  She {does:18564} get adequate calcium and Vitamin D in her diet.  Labs with PCP.   Patient Active Problem List   Diagnosis Date Noted   Colon cancer screening    Family history of breast cancer 04/16/2018   Menopause 04/16/2018   Annual physical exam 04/10/2017   Amenorrhea 04/10/2017    Past Surgical History:  Procedure Laterality Date   CERVICAL CERCLAGE     CESAREAN SECTION     COLONOSCOPY WITH PROPOFOL N/A 09/21/2021   Procedure: COLONOSCOPY WITH PROPOFOL;  Surgeon: Midge Minium, MD;  Location: [facility];  Service: Endoscopy;  Laterality: N/A;   COMBINED HYSTEROSCOPY DIAGNOSTIC / D&C     ESSURE TUBAL LIGATION      Family History  Problem Relation Age of Onset   Diabetes Mother    Hypertension Mother    Breast cancer Mother 42   Lung cancer Father 71   Breast cancer Paternal Aunt     Social History   Socioeconomic History   Marital status: Married    Spouse name: Not on file    Number of children: Not on file   Years of education: Not on file   Highest education level: Not on file  Occupational History   Not on file  Tobacco Use   Smoking status: Never   Smokeless tobacco: Never  Vaping Use   Vaping Use: Never used  Substance and Sexual Activity   Alcohol use: No   Drug use: No   Sexual activity: Yes    Birth control/protection: I.U.D.  Other Topics Concern   Not on file  Social History Narrative   Not on file   Social Determinants of Health   Financial Resource Strain: Not on file  Food Insecurity: Not on file  Transportation Needs: Not on file  Physical Activity: Not on file  Stress: Not on file  Social Connections: Not on file  Intimate Partner Violence: Not on file     Current Outpatient Medications:    simvastatin (ZOCOR) 10 MG tablet, simvastatin 10 mg tablet, Disp: , Rfl:      ROS:  Review of Systems BREAST: No symptoms    Objective: There were no vitals taken for this visit.   OBGyn Exam  Results: No results found for this or any previous visit (from the past 24 hour(s)).  Assessment/Plan:  No diagnosis found.   No orders of the defined types were placed in this encounter.           GYN counsel {counseling: 16159}  F/U  No follow-ups on file.  Amy Buckley Andy, PA-C 05/22/2022 7:47 PM

## 2022-05-23 ENCOUNTER — Encounter: Payer: Self-pay | Admitting: Obstetrics and Gynecology

## 2022-05-23 ENCOUNTER — Ambulatory Visit (INDEPENDENT_AMBULATORY_CARE_PROVIDER_SITE_OTHER): Payer: 59 | Admitting: Obstetrics and Gynecology

## 2022-05-23 ENCOUNTER — Other Ambulatory Visit (HOSPITAL_COMMUNITY)
Admission: RE | Admit: 2022-05-23 | Discharge: 2022-05-23 | Disposition: A | Discharge status: Home / Self Care | Payer: 59 | Source: Ambulatory Visit | Attending: Obstetrics and Gynecology | Admitting: Obstetrics and Gynecology

## 2022-05-23 VITALS — BP 126/88 | Ht 68.0 in | Wt 244.0 lb

## 2022-05-23 DIAGNOSIS — N941 Unspecified dyspareunia: Secondary | ICD-10-CM

## 2022-05-23 DIAGNOSIS — Z124 Encounter for screening for malignant neoplasm of cervix: Secondary | ICD-10-CM | POA: Diagnosis present

## 2022-05-23 DIAGNOSIS — Z01419 Encounter for gynecological examination (general) (routine) without abnormal findings: Secondary | ICD-10-CM | POA: Diagnosis not present

## 2022-05-23 DIAGNOSIS — Z1151 Encounter for screening for human papillomavirus (HPV): Secondary | ICD-10-CM

## 2022-05-23 DIAGNOSIS — Z30431 Encounter for routine checking of intrauterine contraceptive device: Secondary | ICD-10-CM

## 2022-05-23 DIAGNOSIS — Z803 Family history of malignant neoplasm of breast: Secondary | ICD-10-CM

## 2022-05-23 DIAGNOSIS — Z1231 Encounter for screening mammogram for malignant neoplasm of breast: Secondary | ICD-10-CM

## 2022-05-23 DIAGNOSIS — N951 Menopausal and female climacteric states: Secondary | ICD-10-CM

## 2022-05-23 DIAGNOSIS — Z1211 Encounter for screening for malignant neoplasm of colon: Secondary | ICD-10-CM

## 2022-05-23 DIAGNOSIS — Z9189 Other specified personal risk factors, not elsewhere classified: Secondary | ICD-10-CM

## 2022-05-23 NOTE — Patient Instructions (Signed)
I value your feedback and you entrusting us with your care. If you get a [facility] patient survey, I would appreciate you taking the time to let us know about your experience today. Thank you!  [facility]: [phone]      

## 2022-05-28 LAB — CYTOLOGY - PAP
Comment: NEGATIVE
Diagnosis: UNDETERMINED — AB
High risk HPV: NEGATIVE

## 2022-06-19 ENCOUNTER — Ambulatory Visit (INDEPENDENT_AMBULATORY_CARE_PROVIDER_SITE_OTHER): Payer: 59

## 2022-06-19 ENCOUNTER — Ambulatory Visit
Admission: EM | Admit: 2022-06-19 | Discharge: 2022-06-19 | Disposition: A | Discharge status: Home / Self Care | Payer: 59 | Attending: Family Medicine | Admitting: Family Medicine

## 2022-06-19 ENCOUNTER — Encounter: Payer: Self-pay | Admitting: Emergency Medicine

## 2022-06-19 DIAGNOSIS — M25512 Pain in left shoulder: Secondary | ICD-10-CM

## 2022-06-19 DIAGNOSIS — M778 Other enthesopathies, not elsewhere classified: Secondary | ICD-10-CM | POA: Diagnosis not present

## 2022-06-19 MED ORDER — METHOCARBAMOL 500 MG PO TABS: 500.0000 mg | ORAL_TABLET | Freq: Two times a day (BID) | ORAL | 0 refills | Status: DC

## 2022-06-19 MED ORDER — NAPROXEN 500 MG PO TABS: 500.0000 mg | ORAL_TABLET | Freq: Two times a day (BID) | ORAL | 0 refills | Status: DC

## 2022-06-19 NOTE — ED Triage Notes (Signed)
Pt presents with left shoulder pain x 2 days. Pt denies any injury.

## 2022-06-19 NOTE — Discharge Instructions (Addendum)
Your xray did not show a fracture, dislocation or significant arthritis.  Stop by your pharmacy to pick up your prescriptions..  For your  pain, Take 1500 mg Tylenol twice a day, take muscle relaxer (Robaxin) twice a day, take Naprosyn twice a day,  as needed for pain.  Wear your shoulder sling for comfort.  Be sure to take it out several times a day and do your pendulum exercises as discussed.   Apply warm compresses intermittently, as needed.  As pain recedes, begin normal activities slowly as tolerated.  Follow up with primary care provider or an orthopedic provider, if symptoms persist.  Watch for worsening symptoms such as an increasing weakness or loss of sensation, increasing pain and/or the loss of bladder or bowel function. Should any of these occur, go to the emergency department immediately.

## 2022-06-19 NOTE — ED Provider Notes (Signed)
[facility]    CSN: [id number] Arrival date & time: 06/19/22  0808      History   Chief Complaint Chief Complaint  Patient presents with   Shoulder Pain    HPI  HPI Amy Buckley is a 52 y.o. female.   Amy Buckley presents for deep aching left shoulder pain that started on Friday.  She has anterior, posterior and lateral shoulder pain.  She states that at work she was pulling something that was 200 pounds earlier that day.  States she had similar pain several years ago that resolved with medications.  She does not hear any abnormal pops or sounds.  Denies any trauma or falls.  She feels like the pain is better when she moves it more.  She is having difficulty moving her arm like she normally does.  She is right-handed.     Past Medical History:  Diagnosis Date   BRCA negative 03/2018   MyRisk neg except GALNT12 VUS   Dysrhythmia    Family history of breast cancer    Hypercholesterolemia    Increased risk of breast cancer 03/2018   IBIS=29.2%    Patient Active Problem List   Diagnosis Date Noted   Increased risk of breast cancer 05/22/2022   Colon cancer screening    Family history of breast cancer 04/16/2018   Menopause 04/16/2018   Annual physical exam 04/10/2017   Amenorrhea 04/10/2017    Past Surgical History:  Procedure Laterality Date   CERVICAL CERCLAGE     CESAREAN SECTION     COLONOSCOPY WITH PROPOFOL N/A 09/21/2021   Procedure: COLONOSCOPY WITH PROPOFOL;  Surgeon: Midge Minium, MD;  Location: [facility];  Service: Endoscopy;  Laterality: N/A;   COMBINED HYSTEROSCOPY DIAGNOSTIC / D&C     ESSURE TUBAL LIGATION      OB History     Gravida  6   Para  1   Term  1   Preterm      AB  5   Living  1      SAB  5   IAB      Ectopic      Multiple      Live Births               Home Medications    Prior to Admission medications   Medication Sig Start Date End Date Taking? Authorizing Provider  amLODipine  (NORVASC) 5 MG tablet Take 5 mg by mouth daily. 05/03/22  Yes [provider]  hydrochlorothiazide (HYDRODIURIL) 12.5 MG tablet Take 12.5 mg by mouth daily. 05/23/22  Yes [provider]  methocarbamol (ROBAXIN) 500 MG tablet Take 1 tablet (500 mg total) by mouth 2 (two) times daily. 06/19/22  Yes Pawan Knechtel, DO  naproxen (NAPROSYN) 500 MG tablet Take 1 tablet (500 mg total) by mouth 2 (two) times daily with a meal. 06/19/22  Yes Savaya Hakes, DO  rosuvastatin (CRESTOR) 10 MG tablet Take 10 mg by mouth daily. 04/11/22  Yes [provider]    Family History Family History  Problem Relation Age of Onset   Diabetes Mother    Hypertension Mother    Breast cancer Mother 74   Lung cancer Father 55   Breast cancer Paternal Aunt     Social History Social History   Tobacco Use   Smoking status: Never   Smokeless tobacco: Never  Vaping Use   Vaping Use: Never used  Substance Use Topics   Alcohol use: No  Drug use: No     Allergies   Patient has no known allergies.   Review of Systems Review of Systems: :negative unless otherwise stated in HPI.      Physical Exam Triage Vital Signs ED Triage Vitals  Enc Vitals Group     BP 06/19/22 0852 119/77     Pulse Rate 06/19/22 0852 99     Resp 06/19/22 0852 16     Temp 06/19/22 0852 98.6 F (37 C)     Temp Source 06/19/22 0852 Oral     SpO2 06/19/22 0852 98 %     Weight --      Height --      Head Circumference --      Peak Flow --      Pain Score 06/19/22 0850 8     Pain Loc --      Pain Edu? --      Excl. in [place]? --    No data found.  Updated Vital Signs BP 119/77 (BP Location: Right Arm)   Pulse 99   Temp 98.6 F (37 C) (Oral)   Resp 16   SpO2 98%   Visual Acuity Right Eye Distance:   Left Eye Distance:   Bilateral Distance:    Right Eye Near:   Left Eye Near:    Bilateral Near:     Physical Exam GEN: well appearing female in no acute distress  CVS: well perfused  RESP:  speaking in full sentences without pause, no respiratory distress  MSK:  Left shoulder:  No evidence of bony deformity, asymmetry, or muscle atrophy. Tenderness over long head of biceps (bicipital groove).  TTP at [facility] joint, lateral and posterior deltoid Limited active and passive ROM 2/2 to pain in all directions.  Strength 5/5 grip, 4/5 elbow and 4-/5 shoulder.  No abnormal scapular function observed.  Special Tests: attempted but unable to lift and move shoulder to adequately assess  Sensation intact. Peripheral pulses intact.   UC Treatments / Results  Labs (all labs ordered are listed, but only abnormal results are displayed) Labs Reviewed - No data to display  EKG   Radiology DG Shoulder Left  Result Date: 06/19/2022 CLINICAL DATA:  Left shoulder pain with limited range of motion EXAM: LEFT SHOULDER - 2+ VIEW COMPARISON:  None Available. FINDINGS: There is no evidence of fracture or dislocation. There is no evidence of significant arthropathy or other focal bone abnormality. Soft tissues are unremarkable. IMPRESSION: Negative. Electronically Signed   By: Davina Poke D.O.   On: 06/19/2022 09:34    Procedures Procedures (including critical care time)  Medications Ordered in UC Medications - No data to display  Initial Impression / Assessment and Plan / UC Course  I have reviewed the triage vital signs and the nursing notes.  Pertinent labs & imaging results that were available during my care of the patient were reviewed by me and considered in my medical decision making (see chart for details).      Pt is a 52 y.o.  female with 5 days of left shoulder pain after pulling 200 pound rollers at work. Obtained left shoulder plain films.   On chart review, in January 2020, she was seen here at the urgent care and diagnosed with biceps tendinitis.  On exam, she does have tenderness in the bicipital groove, AC joint, lateral and posterior deltoid.  She was given Naprosyn, a  shoulder sling and pendulum exercises that resolved her vein eventually.  She has  known osteoarthritis in bilateral knees.  Plain films personally reviewed by me were unremarkable for fracture or dislocation. Radiologist notes  no soft tissue swelling or significant arthropathy.  I suspect she has some shoulder tendinitis.  She has a shoulder sling at home.  Patient to gradually return to normal activities, as tolerated and continue ordinary activities within the limits permitted by pain. Prescribed Naproxen sodium  and muscle relaxer  for pain relief.  Tylenol PRN. Advised patient to avoid OTC NSAIDs while taking prescription NSAID. Counseled patient on red flag symptoms and when to seek immediate care.  She was advised to do pendulum exercises with her shoulder several times a day.  Work note provided.  Patient to follow up with orthopedic provider, if symptoms do not improve with conservative treatment in the next week.  Return and ED precautions given. Understanding voiced. Discussed MDM, treatment plan and plan for follow-up with patient who agrees with plan.   Final Clinical Impressions(s) / UC Diagnoses   Final diagnoses:  Acute pain of left shoulder  Tendinitis of left shoulder     Discharge Instructions      Your xray did not show a fracture, dislocation or significant arthritis.  Stop by your pharmacy to pick up your prescriptions..  For your  pain, Take 1500 mg Tylenol twice a day, take muscle relaxer (Robaxin) twice a day, take Naprosyn twice a day,  as needed for pain.  Wear your shoulder sling for comfort.  Be sure to take it out several times a day and do your pendulum exercises as discussed.   Apply warm compresses intermittently, as needed.  As pain recedes, begin normal activities slowly as tolerated.  Follow up with primary care provider or an orthopedic provider, if symptoms persist.  Watch for worsening symptoms such as an increasing weakness or loss of sensation, increasing  pain and/or the loss of bladder or bowel function. Should any of these occur, go to the emergency department immediately.        ED Prescriptions     Medication Sig Dispense Auth. Provider   naproxen (NAPROSYN) 500 MG tablet Take 1 tablet (500 mg total) by mouth 2 (two) times daily with a meal. 30 tablet Shoshannah Faubert, DO   methocarbamol (ROBAXIN) 500 MG tablet Take 1 tablet (500 mg total) by mouth 2 (two) times daily. 20 tablet Lyndee Hensen, DO      PDMP not reviewed this encounter.   Lyndee Hensen, DO 06/19/22 1019

## 2022-07-08 ENCOUNTER — Ambulatory Visit
Admission: RE | Admit: 2022-07-08 | Discharge: 2022-07-08 | Disposition: A | Discharge status: Home / Self Care | Payer: 59 | Source: Ambulatory Visit | Attending: Obstetrics and Gynecology | Admitting: Obstetrics and Gynecology

## 2022-07-08 DIAGNOSIS — Z1231 Encounter for screening mammogram for malignant neoplasm of breast: Secondary | ICD-10-CM | POA: Diagnosis present

## 2022-08-18 ENCOUNTER — Other Ambulatory Visit: Payer: Self-pay | Admitting: Internal Medicine

## 2022-08-18 DIAGNOSIS — E782 Mixed hyperlipidemia: Secondary | ICD-10-CM

## 2022-08-18 DIAGNOSIS — I10 Essential (primary) hypertension: Secondary | ICD-10-CM

## 2022-08-21 ENCOUNTER — Encounter: Payer: Self-pay | Admitting: Internal Medicine

## 2022-09-23 ENCOUNTER — Ambulatory Visit: Payer: 59 | Admitting: Internal Medicine

## 2022-09-23 ENCOUNTER — Encounter: Payer: Self-pay | Admitting: Internal Medicine

## 2022-09-23 VITALS — BP 130/78 | HR 71 | Ht 68.0 in | Wt 229.2 lb

## 2022-09-23 DIAGNOSIS — E782 Mixed hyperlipidemia: Secondary | ICD-10-CM | POA: Diagnosis not present

## 2022-09-23 DIAGNOSIS — I10 Essential (primary) hypertension: Secondary | ICD-10-CM | POA: Insufficient documentation

## 2022-09-23 DIAGNOSIS — R7302 Impaired glucose tolerance (oral): Secondary | ICD-10-CM | POA: Diagnosis not present

## 2022-09-23 MED ORDER — ROSUVASTATIN CALCIUM 10 MG PO TABS: 10.0000 mg | ORAL_TABLET | Freq: Every day | ORAL | 3 refills | Status: DC

## 2022-09-23 MED ORDER — HYDROCHLOROTHIAZIDE 12.5 MG PO TABS: 12.5000 mg | ORAL_TABLET | Freq: Every day | ORAL | 3 refills | Status: DC

## 2022-09-23 MED ORDER — AMLODIPINE BESYLATE 5 MG PO TABS: 5.0000 mg | ORAL_TABLET | Freq: Every day | ORAL | 3 refills | Status: DC

## 2022-09-23 NOTE — Progress Notes (Signed)
Established Patient Office Visit  Subjective:  Patient ID: Amy Buckley, female    DOB: 02/09/1971  Age: 52 y.o. MRN: [id number]  Chief Complaint  Patient presents with   Follow-up    3 month follow up    Patient comes in for her follow-up today.  She has lost more weight with strict diet and exercise.  However her blood pressure is slightly elevated.  Patient admits that she ran out of her amlodipine 5 mg and has not been taking it for the last couple of weeks.  However she was still taking her diuretic.  Will send in a refill for the medicine. Patient advised to monitor her blood pressure at home and let us know if it stays elevated. Patient is fasting for blood work today.    No other concerns at this time.   Past Medical History:  Diagnosis Date   BRCA negative 03/2018   MyRisk neg except GALNT12 VUS   Dysrhythmia    Family history of breast cancer    Hypercholesterolemia    Increased risk of breast cancer 03/2018   IBIS=29.2%    Past Surgical History:  Procedure Laterality Date   CERVICAL CERCLAGE     CESAREAN SECTION     COLONOSCOPY WITH PROPOFOL N/A 09/21/2021   Procedure: COLONOSCOPY WITH PROPOFOL;  Surgeon: Midge Minium, MD;  Location: [facility];  Service: Endoscopy;  Laterality: N/A;   COMBINED HYSTEROSCOPY DIAGNOSTIC / D&C     ESSURE TUBAL LIGATION      Social History   Socioeconomic History   Marital status: Married    Spouse name: Not on file   Number of children: Not on file   Years of education: Not on file   Highest education level: Not on file  Occupational History   Not on file  Tobacco Use   Smoking status: Never   Smokeless tobacco: Never  Vaping Use   Vaping Use: Never used  Substance and Sexual Activity   Alcohol use: No   Drug use: No   Sexual activity: Yes    Birth control/protection: I.U.D.  Other Topics Concern   Not on file  Social History Narrative   Not on file   Social Determinants of Health   Financial  Resource Strain: Not on file  Food Insecurity: Not on file  Transportation Needs: Not on file  Physical Activity: Not on file  Stress: Not on file  Social Connections: Not on file  Intimate Partner Violence: Not on file    Family History  Problem Relation Age of Onset   Diabetes Mother    Hypertension Mother    Breast cancer Mother 23   Lung cancer Father 65   Breast cancer Paternal Aunt     No Known Allergies  Review of Systems  Constitutional: Negative.  Negative for chills, diaphoresis, fever and malaise/fatigue.  HENT: Negative.    Eyes: Negative.   Respiratory:  Negative for cough, shortness of breath and wheezing.   Cardiovascular:  Negative for chest pain, palpitations, orthopnea, claudication and leg swelling.  Gastrointestinal:  Negative for abdominal pain, constipation, heartburn and nausea.  Genitourinary:  Negative for dysuria.  Musculoskeletal:  Negative for joint pain and myalgias.  Skin: Negative.   Neurological:  Negative for dizziness and headaches.  Psychiatric/Behavioral:  Negative for depression. The patient is not nervous/anxious and does not have insomnia.        Objective:   BP 130/78   Pulse 71   Ht 5\' 8"  (  1.727 m)   Wt 229 lb 3.2 oz (104 kg)   SpO2 97%   BMI 34.85 kg/m   Vitals:   09/23/22 0923  BP: 130/78  Pulse: 71  Height: 5\' 8"  (1.727 m)  Weight: 229 lb 3.2 oz (104 kg)  SpO2: 97%  BMI (Calculated): 34.86    Physical Exam Vitals and nursing note reviewed.  Constitutional:      Appearance: Normal appearance.  Cardiovascular:     Rate and Rhythm: Normal rate and regular rhythm.     Pulses: Normal pulses.     Heart sounds: Normal heart sounds.  Pulmonary:     Effort: Pulmonary effort is normal.     Breath sounds: Normal breath sounds.  Abdominal:     General: Bowel sounds are normal.     Palpations: Abdomen is soft. There is no mass.     Tenderness: There is no abdominal tenderness.  Musculoskeletal:        General:  Normal range of motion.     Cervical back: Normal range of motion and neck supple.     Right lower leg: No edema.     Left lower leg: No edema.  Skin:    General: Skin is warm and dry.  Neurological:     General: No focal deficit present.     Mental Status: She is alert and oriented to person, place, and time.  Psychiatric:        Mood and Affect: Mood normal.        Behavior: Behavior normal.      No results found for any visits on 09/23/22.  No results found for this or any previous visit (from the past 2160 hour(s)).    Assessment & Plan:  Patient advised to continue taking her medications.  She is to monitor her blood pressure at home and let us know.  Fasting blood work today. Problem List Items Addressed This Visit     Impaired glucose tolerance - Primary   Relevant Orders   Hemoglobin A1c   Mixed hyperlipidemia   Relevant Medications   amLODipine (NORVASC) 5 MG tablet   rosuvastatin (CRESTOR) 10 MG tablet   hydrochlorothiazide (HYDRODIURIL) 12.5 MG tablet   Other Relevant Orders   Lipid Panel w/o Chol/HDL Ratio   Essential hypertension, benign   Relevant Medications   amLODipine (NORVASC) 5 MG tablet   rosuvastatin (CRESTOR) 10 MG tablet   hydrochlorothiazide (HYDRODIURIL) 12.5 MG tablet   Other Relevant Orders   CMP14+EGFR    Return in about 3 months (around 12/23/2022).   Total time spent: 25 minutes  Margaretann Loveless, MD  09/23/2022

## 2022-09-24 LAB — CMP14+EGFR
ALT: 25 IU/L (ref 0–32)
AST: 26 IU/L (ref 0–40)
Albumin/Globulin Ratio: 1.5 (ref 1.2–2.2)
Albumin: 4.3 g/dL (ref 3.8–4.9)
Alkaline Phosphatase: 84 IU/L (ref 44–121)
BUN/Creatinine Ratio: 13 (ref 9–23)
BUN: 11 mg/dL (ref 6–24)
Bilirubin Total: 0.6 mg/dL (ref 0.0–1.2)
CO2: 28 mmol/L (ref 20–29)
Calcium: 9.4 mg/dL (ref 8.7–10.2)
Chloride: 98 mmol/L (ref 96–106)
Creatinine, Ser: 0.82 mg/dL (ref 0.57–1.00)
Globulin, Total: 2.9 g/dL (ref 1.5–4.5)
Glucose: 85 mg/dL (ref 70–99)
Potassium: 4.3 mmol/L (ref 3.5–5.2)
Sodium: 138 mmol/L (ref 134–144)
Total Protein: 7.2 g/dL (ref 6.0–8.5)
eGFR: 87 mL/min/{1.73_m2} (ref >59)

## 2022-09-24 LAB — LIPID PANEL W/O CHOL/HDL RATIO
Cholesterol, Total: 144 mg/dL (ref 100–199)
HDL: 50 mg/dL (ref >39)
LDL Chol Calc (NIH): 81 mg/dL (ref 0–99)
Triglycerides: 64 mg/dL (ref 0–149)
VLDL Cholesterol Cal: 13 mg/dL (ref 5–40)

## 2022-09-24 LAB — HEMOGLOBIN A1C
Est. average glucose Bld gHb Est-mCnc: 128 mg/dL
Hgb A1c MFr Bld: 6.1 % — ABNORMAL HIGH (ref 4.8–5.6)

## 2022-12-23 ENCOUNTER — Ambulatory Visit: Payer: 59 | Admitting: Internal Medicine

## 2022-12-31 ENCOUNTER — Encounter: Payer: Self-pay | Admitting: Internal Medicine

## 2022-12-31 ENCOUNTER — Ambulatory Visit: Payer: 59 | Admitting: Internal Medicine

## 2022-12-31 VITALS — BP 112/74 | HR 58 | Ht 68.0 in | Wt 224.6 lb

## 2022-12-31 DIAGNOSIS — R001 Bradycardia, unspecified: Secondary | ICD-10-CM | POA: Diagnosis not present

## 2022-12-31 DIAGNOSIS — I10 Essential (primary) hypertension: Secondary | ICD-10-CM | POA: Diagnosis not present

## 2022-12-31 DIAGNOSIS — E782 Mixed hyperlipidemia: Secondary | ICD-10-CM | POA: Diagnosis not present

## 2022-12-31 DIAGNOSIS — R7303 Prediabetes: Secondary | ICD-10-CM | POA: Insufficient documentation

## 2022-12-31 NOTE — Progress Notes (Signed)
Established Patient Office Visit  Subjective:  Patient ID: Amy Buckley, female    DOB: [birth year]  Age: 52 y.o. MRN: [id number]  Chief Complaint  Patient presents with   Follow-up    3 month follow up    Patient comes in for her follow-up today.  She has been feeling well and has slowly been losing weight with diet and exercise.  She is fasting for blood work.  Her pulse is slow today which is unusual.  EKG also shows sinus bradycardia.  Patient denies any chest pain or dizziness. Will check labs and schedule cardiology consult.    No other concerns at this time.   Past Medical History:  Diagnosis Date   BRCA negative 03/2018   MyRisk neg except GALNT12 VUS   Dysrhythmia    Family history of breast cancer    Hypercholesterolemia    Increased risk of breast cancer 03/2018   IBIS=29.2%    Past Surgical History:  Procedure Laterality Date   CERVICAL CERCLAGE     CESAREAN SECTION     COLONOSCOPY WITH PROPOFOL N/A 09/21/2021   Procedure: COLONOSCOPY WITH PROPOFOL;  Surgeon: Midge Minium, MD;  Location: [facility];  Service: Endoscopy;  Laterality: N/A;   COMBINED HYSTEROSCOPY DIAGNOSTIC / D&C     ESSURE TUBAL LIGATION      Social History   Socioeconomic History   Marital status: Married    Spouse name: Not on file   Number of children: Not on file   Years of education: Not on file   Highest education level: Not on file  Occupational History   Not on file  Tobacco Use   Smoking status: Never   Smokeless tobacco: Never  Vaping Use   Vaping status: Never Used  Substance and Sexual Activity   Alcohol use: No   Drug use: No   Sexual activity: Yes    Birth control/protection: I.U.D.  Other Topics Concern   Not on file  Social History Narrative   Not on file   Social Determinants of Health   Financial Resource Strain: Not on file  Food Insecurity: Not on file  Transportation Needs: Not on file  Physical Activity: Not on file  Stress: Not on  file  Social Connections: Not on file  Intimate Partner Violence: Not on file    Family History  Problem Relation Age of Onset   Diabetes Mother    Hypertension Mother    Breast cancer Mother 65   Lung cancer Father 42   Breast cancer Paternal Aunt     No Known Allergies  Review of Systems  Constitutional: Negative.  Negative for diaphoresis and malaise/fatigue.  HENT: Negative.    Eyes: Negative.   Respiratory: Negative.  Negative for cough and shortness of breath.   Cardiovascular: Negative.  Negative for chest pain, palpitations and leg swelling.  Gastrointestinal: Negative.  Negative for abdominal pain, constipation, diarrhea, heartburn, nausea and vomiting.  Genitourinary: Negative.  Negative for dysuria and flank pain.  Musculoskeletal: Negative.  Negative for joint pain and myalgias.  Skin: Negative.   Neurological: Negative.  Negative for dizziness and headaches.  Endo/Heme/Allergies: Negative.   Psychiatric/Behavioral: Negative.  Negative for depression and suicidal ideas. The patient is not nervous/anxious.        Objective:   BP 112/74   Pulse (!) 58   Ht 5\' 8"  (1.727 m)   Wt 224 lb 9.6 oz (101.9 kg)   SpO2 94%   BMI 34.15 kg/m  Vitals:   12/31/22 0928  BP: 112/74  Pulse: (!) 58  Height: 5\' 8"  (1.727 m)  Weight: 224 lb 9.6 oz (101.9 kg)  SpO2: 94%  BMI (Calculated): 34.16    Physical Exam Vitals and nursing note reviewed.  Constitutional:      Appearance: Normal appearance.  HENT:     Head: Normocephalic and atraumatic.     Nose: Nose normal.     Mouth/Throat:     Mouth: Mucous membranes are moist.     Pharynx: Oropharynx is clear.  Eyes:     Conjunctiva/sclera: Conjunctivae normal.     Pupils: Pupils are equal, round, and reactive to light.  Cardiovascular:     Rate and Rhythm: Normal rate and regular rhythm.     Pulses: Normal pulses.     Heart sounds: Normal heart sounds. No murmur heard. Pulmonary:     Effort: Pulmonary effort is  normal.     Breath sounds: Normal breath sounds. No wheezing.  Abdominal:     General: Bowel sounds are normal.     Palpations: Abdomen is soft.     Tenderness: There is no abdominal tenderness. There is no right CVA tenderness or left CVA tenderness.  Musculoskeletal:        General: Normal range of motion.     Cervical back: Normal range of motion.     Right lower leg: No edema.     Left lower leg: No edema.  Skin:    General: Skin is warm and dry.  Neurological:     General: No focal deficit present.     Mental Status: She is alert and oriented to person, place, and time.  Psychiatric:        Mood and Affect: Mood normal.        Behavior: Behavior normal.      No results found for any visits on 12/31/22.  No results found for this or any previous visit (from the past 2160 hour(s)).    Assessment & Plan:  Check labs today.  Schedule cardiology consult for new sinus bradycardia. Problem List Items Addressed This Visit     Mixed hyperlipidemia   Relevant Orders   Lipid Panel w/o Chol/HDL Ratio   Essential hypertension, benign - Primary   Relevant Orders   CMP14+EGFR   Bradycardia   Relevant Orders   CBC With Differential   TSH+T4F+T3Free   Ambulatory referral to Cardiology   EKG 12-Lead   Prediabetes   Relevant Orders   Hemoglobin A1c    Return in about 3 months (around 04/02/2023).   Total time spent: 30 minutes  Margaretann Loveless, MD  12/31/2022   This document may have been prepared by [facility] Voice Recognition software and as such may include unintentional dictation errors.

## 2023-01-09 ENCOUNTER — Encounter: Payer: Self-pay | Admitting: Internal Medicine

## 2023-01-10 ENCOUNTER — Ambulatory Visit: Payer: 59 | Admitting: Cardiovascular Disease

## 2023-01-10 ENCOUNTER — Encounter: Payer: Self-pay | Admitting: Cardiovascular Disease

## 2023-01-10 VITALS — BP 122/82 | HR 71 | Ht 68.0 in | Wt 223.4 lb

## 2023-01-10 DIAGNOSIS — I10 Essential (primary) hypertension: Secondary | ICD-10-CM | POA: Diagnosis not present

## 2023-01-10 DIAGNOSIS — R001 Bradycardia, unspecified: Secondary | ICD-10-CM

## 2023-01-10 DIAGNOSIS — I340 Nonrheumatic mitral (valve) insufficiency: Secondary | ICD-10-CM

## 2023-01-10 DIAGNOSIS — E782 Mixed hyperlipidemia: Secondary | ICD-10-CM

## 2023-01-10 DIAGNOSIS — R7302 Impaired glucose tolerance (oral): Secondary | ICD-10-CM | POA: Diagnosis not present

## 2023-01-10 DIAGNOSIS — R9431 Abnormal electrocardiogram [ECG] [EKG]: Secondary | ICD-10-CM

## 2023-01-10 DIAGNOSIS — R7303 Prediabetes: Secondary | ICD-10-CM

## 2023-01-10 NOTE — Progress Notes (Signed)
Cardiology Office Note   Date:  01/10/2023   ID:  Amy Buckley, DOB 16-Oct-1970, MRN [id number]  PCP:  Amy Loveless, MD  Cardiologist:  Amy Blackwater, MD      History of Present Illness: Amy Buckley is a 52 y.o. female who presents for  Chief Complaint  Patient presents with   Establish Care    Bradycardia    Doing well, has tingling in hands/feet  Palpitations  This is a new problem. The current episode started more than 1 month ago.      Past Medical History:  Diagnosis Date   BRCA negative 03/2018   MyRisk neg except GALNT12 VUS   Dysrhythmia    Family history of breast cancer    Hypercholesterolemia    Increased risk of breast cancer 03/2018   IBIS=29.2%     Past Surgical History:  Procedure Laterality Date   CERVICAL CERCLAGE     CESAREAN SECTION     COLONOSCOPY WITH PROPOFOL N/A 09/21/2021   Procedure: COLONOSCOPY WITH PROPOFOL;  Surgeon: Midge Minium, MD;  Location: [facility];  Service: Endoscopy;  Laterality: N/A;   COMBINED HYSTEROSCOPY DIAGNOSTIC / D&C     ESSURE TUBAL LIGATION       Current Outpatient Medications  Medication Sig Dispense Refill   amLODipine (NORVASC) 5 MG tablet Take 1 tablet (5 mg total) by mouth daily. 90 tablet 3   hydrochlorothiazide (HYDRODIURIL) 12.5 MG tablet Take 1 tablet (12.5 mg total) by mouth daily. 90 tablet 3   rosuvastatin (CRESTOR) 10 MG tablet Take 1 tablet (10 mg total) by mouth daily. 90 tablet 3   meloxicam (MOBIC) 15 MG tablet Take 1 tablet by mouth daily. (Patient not taking: Reported on 12/31/2022)     methocarbamol (ROBAXIN) 500 MG tablet Take 1 tablet (500 mg total) by mouth 2 (two) times daily. (Patient not taking: Reported on 12/31/2022) 20 tablet 0   naproxen (NAPROSYN) 500 MG tablet Take 1 tablet (500 mg total) by mouth 2 (two) times daily with a meal. (Patient not taking: Reported on 12/31/2022) 30 tablet 0   No current facility-administered medications for this visit.    Allergies:    Patient has no known allergies.    Social History:   reports that she has never smoked. She has never used smokeless tobacco. She reports that she does not drink alcohol and does not use drugs.   Family History:  family history includes Breast cancer in her paternal aunt; Breast cancer (age of onset: 85) in her mother; Diabetes in her mother; Hypertension in her mother; Lung cancer (age of onset: 64) in her father.    ROS:     Review of Systems  Constitutional: Negative.   HENT: Negative.    Eyes: Negative.   Respiratory: Negative.    Cardiovascular:  Positive for palpitations.  Gastrointestinal: Negative.   Genitourinary: Negative.   Musculoskeletal: Negative.   Skin: Negative.   Neurological: Negative.   Endo/Heme/Allergies: Negative.   Psychiatric/Behavioral: Negative.    All other systems reviewed and are negative.     All other systems are reviewed and negative.    PHYSICAL EXAM: VS:  BP 122/82   Pulse 71   Ht 5\' 8"  (1.727 m)   Wt 223 lb 6.4 oz (101.3 kg)   SpO2 94%   BMI 33.97 kg/m  , BMI Body mass index is 33.97 kg/m. Last weight:  Wt Readings from Last 3 Encounters:  01/10/23 223 lb 6.4 oz (  101.3 kg)  12/31/22 224 lb 9.6 oz (101.9 kg)  09/23/22 229 lb 3.2 oz (104 kg)     Physical Exam Constitutional:      Appearance: Normal appearance.  Cardiovascular:     Rate and Rhythm: Normal rate and regular rhythm.     Heart sounds: Normal heart sounds.  Pulmonary:     Effort: Pulmonary effort is normal.     Breath sounds: Normal breath sounds.  Musculoskeletal:     Right lower leg: No edema.     Left lower leg: No edema.  Neurological:     Mental Status: She is alert.       EKG:   Recent Labs: 12/31/2022: ALT 19; BUN 11; Creatinine, Ser 0.80; Hemoglobin 12.7; Platelets 245; Potassium 4.4; Sodium 140; TSH 1.320    Lipid Panel    Component Value Date/Time   CHOL 148 12/31/2022 1015   TRIG 68 12/31/2022 1015   HDL 50 12/31/2022 1015   LDLCALC 84  12/31/2022 1015      TESTS    [facility] [address] [place], [place] [address] [phone] STUDY:  Rest / Gated Stress Myocardial Perfusion With Wall Motion, Left Ventricular Ejection Fraction.Treadmill Stress Test. SEX:      Female                                                                                                                                                                                                                   REFERRING PHYSICIAN:  Adrian Buckley  INDICATION FOR STUDY:  SOB.                                                                                                                                                                                                                    TECHNIQUE:  Approximately 45 minutes following the intravenous administration of 12.3  mCi of Tc-11m Sestamibi with the patient at rest in a reclined supine position with arms above their head if able to do so, SPECT imaging of the heart was performed.  The patient then underwent stress testing.  At peak stress, the patient was injected intravenously with 31.8  mCi of Tc-78m Sestamibi.  Approximately 45 minutes later in the same position as rest imaging, gated SPECT imaging of the heart was performed.  STRESS BY:  Amy Blackwater, MD PROTOCOL:   Smitty Cords                                                                                       MAX PRED HR: 175                     85%: 149               75%: 131                                                                                                                   RESTING BP: 126/80    RESTING HR: 83   PEAK BP: 150/96     PEAK HR: 146  EXERCISE  DURATION: 7:01                                             METS: 8.4     REASON FOR TEST TERMINATION: Fatigue                                                                                                                                  SYMPTOMS: Fatigue  [facility] TREADMILL SCORE: 7                                       RISK: Low                                                                                                                                                                                                            EKG RESULTS: NSR. 68/min. No significant ST changes at peak exercise.  PERFUSION/WALL MOTION FINDINGS:  EF = 81%. Small mild fixed apical wall defect. Normal wall motion.                                                                         IMPRESSION:   Equivocal stress test with normal LVEF.                                                                                                                                                                                                                                                                                      Amy Blackwater, MD Stress Interpreting Physician / Nuclear Interpreting Physician                        Amy Blackwater MD  Electronically signed by: Amy Buckley     Date: 02/21/2017 10:38 REASON FOR VISIT  Visit for: Echocardiogram - Murmur  Sex: Female   wt= 234 Lbs.  BP= 138/73  Height= 66 inches.        TESTS  Imaging: Echocardiogram:  An echocardiogram in (2-d) mode was performed and in Doppler mode with color flow  velocity mapping was performed. The aortic valve cusps are normal 1.9 cm, flow velocity was normal 1.3 m/s, and normal calculated aortic valve systolic mean flow gradient 4.7 mmHg. Mitral valve diastolic peak flow velocity E .77 m/s and E/A ratio 1.1. Aortic root diameter 2.7 cm. The LVOT internal diameter was normal 1.9 cm and flow velocity was normal 1.0 m/s. LV systolic dimension 2.6 cm, diastolic 4.0 cm, posterior wall thickness 1.0 cm, fractional shortening 35 %, and EF 65 %. IVS thickness 1.3 cm. LA dimension 5.4 cm  RIGHT atrium= 13.0 cm2. Mitral Valve =  Ea= 10.5  DT= .220  A- wave duration = .120 m.sec. Tricuspid Valve =  TR jet V= 2.1     RAP= 5  RVSP= 22.6 mmHg. Tricuspid Valve has Mild Regurgitation. Pulmonic Valve= PIEDV= .88 m/s. Mitral Valve has Mild Regurgitation. Aortic Valve is Normal. Pulmonic Valve has Mild Regurgitation.     ASSESSMENT  Mildly dilated left atrium with left ventricle, right atrium and ventricle, and aorta appearing normal in size.  Normal LV systolic function.  Normal wall motion.  Mild left ventricular hypertrophy with GRADE  2 (psuedonormalization) diastolic dysfunction.  Mild pulmonary regurgitation.  Mild tricuspid regurgitation.   Normal pulmonary artery pressure.   Mild mitral regurgitation.  No pericardial effusion.     THERAPY   Referring physician: Margaretann Buckley  Sonographer: Danford Bad, RCS.      Amy Blackwater MD  Electronically signed by: Amy Buckley     Date: 02/14/2017 10:01 Other studies Reviewed: Additional studies/ records that were reviewed today include:  Review of the above records demonstrates:       No data to display            ASSESSMENT AND PLAN:    ICD-10-CM   1. Essential hypertension, benign  I10 MYOCARDIAL PERFUSION IMAGING    PCV ECHOCARDIOGRAM COMPLETE    2. Impaired glucose tolerance  R73.02 MYOCARDIAL PERFUSION IMAGING    PCV ECHOCARDIOGRAM COMPLETE    3. Bradycardia  R00.1  MYOCARDIAL PERFUSION IMAGING    PCV ECHOCARDIOGRAM COMPLETE    4. Mixed hyperlipidemia  E78.2 MYOCARDIAL PERFUSION IMAGING    PCV ECHOCARDIOGRAM COMPLETE    5. Prediabetes  R73.03 MYOCARDIAL PERFUSION IMAGING    PCV ECHOCARDIOGRAM COMPLETE    6. Abnormal electrocardiogram (ECG) (EKG)  R94.31 MYOCARDIAL PERFUSION IMAGING    PCV ECHOCARDIOGRAM COMPLETE   has numbness in fingers, palpitation , repeat stress test and echo    7. Nonrheumatic mitral valve regurgitation  I34.0 MYOCARDIAL PERFUSION IMAGING    PCV ECHOCARDIOGRAM COMPLETE       Problem List Items Addressed This Visit       Cardiovascular and Mediastinum   Essential hypertension, benign - Primary   Relevant Orders   MYOCARDIAL PERFUSION IMAGING   PCV ECHOCARDIOGRAM COMPLETE     Endocrine   Impaired glucose tolerance   Relevant Orders   MYOCARDIAL PERFUSION IMAGING   PCV ECHOCARDIOGRAM COMPLETE     Other   Mixed hyperlipidemia   Relevant Orders   MYOCARDIAL PERFUSION IMAGING   PCV ECHOCARDIOGRAM COMPLETE   Bradycardia   Relevant Orders   MYOCARDIAL PERFUSION IMAGING   PCV ECHOCARDIOGRAM COMPLETE   Prediabetes   Relevant Orders   MYOCARDIAL PERFUSION IMAGING   PCV ECHOCARDIOGRAM COMPLETE   Other Visit Diagnoses     Abnormal electrocardiogram (ECG) (EKG)       has numbness in fingers, palpitation , repeat stress test and echo   Relevant Orders   MYOCARDIAL PERFUSION IMAGING   PCV ECHOCARDIOGRAM COMPLETE   Nonrheumatic mitral valve regurgitation       Relevant Orders   MYOCARDIAL PERFUSION IMAGING   PCV ECHOCARDIOGRAM COMPLETE          Disposition:   No follow-ups on file.    Total time spent: 45 minutes  Signed,  Amy Blackwater, MD  01/10/2023 9:47 AM    [facility]

## 2023-01-24 ENCOUNTER — Ambulatory Visit (INDEPENDENT_AMBULATORY_CARE_PROVIDER_SITE_OTHER): Payer: 59

## 2023-01-24 DIAGNOSIS — R7303 Prediabetes: Secondary | ICD-10-CM

## 2023-01-24 DIAGNOSIS — R001 Bradycardia, unspecified: Secondary | ICD-10-CM

## 2023-01-24 DIAGNOSIS — R9431 Abnormal electrocardiogram [ECG] [EKG]: Secondary | ICD-10-CM

## 2023-01-24 DIAGNOSIS — E782 Mixed hyperlipidemia: Secondary | ICD-10-CM

## 2023-01-24 DIAGNOSIS — I340 Nonrheumatic mitral (valve) insufficiency: Secondary | ICD-10-CM

## 2023-01-24 DIAGNOSIS — R7302 Impaired glucose tolerance (oral): Secondary | ICD-10-CM

## 2023-01-24 DIAGNOSIS — I10 Essential (primary) hypertension: Secondary | ICD-10-CM

## 2023-01-24 MED ORDER — TECHNETIUM TC 99M SESTAMIBI GENERIC - CARDIOLITE
9.8000 | Freq: Once | INTRAVENOUS | Status: AC | PRN
  Administered 2023-01-24: 9.8 via INTRAVENOUS

## 2023-01-24 MED ORDER — TECHNETIUM TC 99M SESTAMIBI GENERIC - CARDIOLITE
31.8000 | Freq: Once | INTRAVENOUS | Status: AC | PRN
  Administered 2023-01-24: 31.8 via INTRAVENOUS

## 2023-01-28 ENCOUNTER — Ambulatory Visit (INDEPENDENT_AMBULATORY_CARE_PROVIDER_SITE_OTHER): Payer: 59

## 2023-01-28 DIAGNOSIS — I340 Nonrheumatic mitral (valve) insufficiency: Secondary | ICD-10-CM

## 2023-01-28 DIAGNOSIS — E782 Mixed hyperlipidemia: Secondary | ICD-10-CM

## 2023-01-28 DIAGNOSIS — R9431 Abnormal electrocardiogram [ECG] [EKG]: Secondary | ICD-10-CM

## 2023-01-28 DIAGNOSIS — I10 Essential (primary) hypertension: Secondary | ICD-10-CM

## 2023-01-28 DIAGNOSIS — I361 Nonrheumatic tricuspid (valve) insufficiency: Secondary | ICD-10-CM | POA: Diagnosis not present

## 2023-01-28 DIAGNOSIS — R7302 Impaired glucose tolerance (oral): Secondary | ICD-10-CM

## 2023-01-28 DIAGNOSIS — R001 Bradycardia, unspecified: Secondary | ICD-10-CM

## 2023-01-28 DIAGNOSIS — R7303 Prediabetes: Secondary | ICD-10-CM

## 2023-01-31 ENCOUNTER — Ambulatory Visit: Payer: 59 | Admitting: Cardiovascular Disease

## 2023-01-31 ENCOUNTER — Encounter: Payer: Self-pay | Admitting: Cardiovascular Disease

## 2023-01-31 VITALS — BP 120/80 | HR 65 | Ht 68.0 in | Wt 222.8 lb

## 2023-01-31 DIAGNOSIS — R7302 Impaired glucose tolerance (oral): Secondary | ICD-10-CM

## 2023-01-31 DIAGNOSIS — E782 Mixed hyperlipidemia: Secondary | ICD-10-CM

## 2023-01-31 DIAGNOSIS — I10 Essential (primary) hypertension: Secondary | ICD-10-CM | POA: Diagnosis not present

## 2023-01-31 DIAGNOSIS — I340 Nonrheumatic mitral (valve) insufficiency: Secondary | ICD-10-CM | POA: Diagnosis not present

## 2023-01-31 DIAGNOSIS — R9439 Abnormal result of other cardiovascular function study: Secondary | ICD-10-CM

## 2023-01-31 DIAGNOSIS — I259 Chronic ischemic heart disease, unspecified: Secondary | ICD-10-CM

## 2023-01-31 MED ORDER — METOPROLOL TARTRATE 25 MG PO TABS: ORAL_TABLET | ORAL | 11 refills | Status: DC

## 2023-01-31 NOTE — Progress Notes (Addendum)
Cardiology Office Note   Date:  01/31/2023   ID:  [name], DOB 10-16-1970, MRN [id number]  PCP:  Margaretann Loveless, MD  Cardiologist:  Adrian Blackwater, MD      History of Present Illness: Amy Buckley is a 52 y.o. female who presents for  Chief Complaint  Patient presents with   Follow-up    NST&Echo results    Doing well      Past Medical History:  Diagnosis Date   BRCA negative 03/2018   MyRisk neg except GALNT12 VUS   Dysrhythmia    Family history of breast cancer    Hypercholesterolemia    Increased risk of breast cancer 03/2018   IBIS=29.2%     Past Surgical History:  Procedure Laterality Date   CERVICAL CERCLAGE     CESAREAN SECTION     COLONOSCOPY WITH PROPOFOL N/A 09/21/2021   Procedure: COLONOSCOPY WITH PROPOFOL;  Surgeon: Midge Minium, MD;  Location: [facility];  Service: Endoscopy;  Laterality: N/A;   COMBINED HYSTEROSCOPY DIAGNOSTIC / D&C     ESSURE TUBAL LIGATION       Current Outpatient Medications  Medication Sig Dispense Refill   metoprolol tartrate (LOPRESSOR) 25 MG tablet Take 1 tab night before and 1 tab 90 minutes prior to cta coronaries 60 tablet 11   amLODipine (NORVASC) 5 MG tablet Take 1 tablet (5 mg total) by mouth daily. 90 tablet 3   hydrochlorothiazide (HYDRODIURIL) 12.5 MG tablet Take 1 tablet (12.5 mg total) by mouth daily. 90 tablet 3   meloxicam (MOBIC) 15 MG tablet Take 1 tablet by mouth daily. (Patient not taking: Reported on 12/31/2022)     methocarbamol (ROBAXIN) 500 MG tablet Take 1 tablet (500 mg total) by mouth 2 (two) times daily. (Patient not taking: Reported on 12/31/2022) 20 tablet 0   naproxen (NAPROSYN) 500 MG tablet Take 1 tablet (500 mg total) by mouth 2 (two) times daily with a meal. (Patient not taking: Reported on 12/31/2022) 30 tablet 0   rosuvastatin (CRESTOR) 10 MG tablet Take 1 tablet (10 mg total) by mouth daily. 90 tablet 3   No current facility-administered medications for this visit.     Allergies:   Patient has no known allergies.    Social History:   reports that she has never smoked. She has never used smokeless tobacco. She reports that she does not drink alcohol and does not use drugs.   Family History:  family history includes Breast cancer in her paternal aunt; Breast cancer (age of onset: 39) in her mother; Diabetes in her mother; Hypertension in her mother; Lung cancer (age of onset: 3) in her father.    ROS:     Review of Systems  Constitutional: Negative.   HENT: Negative.    Eyes: Negative.   Respiratory: Negative.    Gastrointestinal: Negative.   Genitourinary: Negative.   Musculoskeletal: Negative.   Skin: Negative.   Neurological: Negative.   Endo/Heme/Allergies: Negative.   Psychiatric/Behavioral: Negative.    All other systems reviewed and are negative.     All other systems are reviewed and negative.    PHYSICAL EXAM: VS:  BP 120/80   Pulse 65   Ht 5\' 8"  (1.727 m)   Wt 222 lb 12.8 oz (101.1 kg)   SpO2 97%   BMI 33.88 kg/m  , BMI Body mass index is 33.88 kg/m. Last weight:  Wt Readings from Last 3 Encounters:  01/31/23 222 lb 12.8 oz (101.1 kg)  01/10/23 223 lb 6.4 oz (101.3 kg)  12/31/22 224 lb 9.6 oz (101.9 kg)     Physical Exam Constitutional:      Appearance: Normal appearance.  Cardiovascular:     Rate and Rhythm: Normal rate and regular rhythm.     Heart sounds: Normal heart sounds.  Pulmonary:     Effort: Pulmonary effort is normal.     Breath sounds: Normal breath sounds.  Musculoskeletal:     Right lower leg: No edema.     Left lower leg: No edema.  Neurological:     Mental Status: She is alert.       EKG:   Recent Labs: 12/31/2022: ALT 19; BUN 11; Creatinine, Ser 0.80; Hemoglobin 12.7; Platelets 245; Potassium 4.4; Sodium 140; TSH 1.320    Lipid Panel    Component Value Date/Time   CHOL 148 12/31/2022 1015   TRIG 68 12/31/2022 1015   HDL 50 12/31/2022 1015   LDLCALC 84 12/31/2022 1015       Other studies Reviewed: Additional studies/ records that were reviewed today include:  Review of the above records demonstrates:       No data to display            ASSESSMENT AND PLAN:    ICD-10-CM   1. Essential hypertension, benign  I10 CT CORONARY MORPH W/CTA COR W/SCORE W/CA W/CM &/OR WO/CM    metoprolol tartrate (LOPRESSOR) 25 MG tablet    2. Mixed hyperlipidemia  E78.2 CT CORONARY MORPH W/CTA COR W/SCORE W/CA W/CM &/OR WO/CM    metoprolol tartrate (LOPRESSOR) 25 MG tablet    3. Impaired glucose tolerance  R73.02 CT CORONARY MORPH W/CTA COR W/SCORE W/CA W/CM &/OR WO/CM    metoprolol tartrate (LOPRESSOR) 25 MG tablet    4. Nonrheumatic mitral valve regurgitation  I34.0 CT CORONARY MORPH W/CTA COR W/SCORE W/CA W/CM &/OR WO/CM    metoprolol tartrate (LOPRESSOR) 25 MG tablet    5. Abnormal nuclear stress test  R94.39 CT CORONARY MORPH W/CTA COR W/SCORE W/CA W/CM &/OR WO/CM    metoprolol tartrate (LOPRESSOR) 25 MG tablet   equivacal stress test, advise CCTA, BUN creat 8/4 normal    6. Chest pain due to myocardial ischemia, unspecified ischemic chest pain type  I25.9 CT CORONARY MORPH W/CTA COR W/SCORE W/CA W/CM &/OR WO/CM    metoprolol tartrate (LOPRESSOR) 25 MG tablet       Problem List Items Addressed This Visit       Cardiovascular and Mediastinum   Essential hypertension, benign - Primary   Relevant Medications   metoprolol tartrate (LOPRESSOR) 25 MG tablet   Other Relevant Orders   CT CORONARY MORPH W/CTA COR W/SCORE W/CA W/CM &/OR WO/CM     Endocrine   Impaired glucose tolerance   Relevant Medications   metoprolol tartrate (LOPRESSOR) 25 MG tablet   Other Relevant Orders   CT CORONARY MORPH W/CTA COR W/SCORE W/CA W/CM &/OR WO/CM     Other   Mixed hyperlipidemia   Relevant Medications   metoprolol tartrate (LOPRESSOR) 25 MG tablet   Other Relevant Orders   CT CORONARY MORPH W/CTA COR W/SCORE W/CA W/CM &/OR WO/CM   Other Visit Diagnoses      Nonrheumatic mitral valve regurgitation       Relevant Medications   metoprolol tartrate (LOPRESSOR) 25 MG tablet   Other Relevant Orders   CT CORONARY MORPH W/CTA COR W/SCORE W/CA W/CM &/OR WO/CM   Abnormal nuclear stress test  equivacal stress test, advise CCTA, BUN creat 8/4 normal   Relevant Medications   metoprolol tartrate (LOPRESSOR) 25 MG tablet   Other Relevant Orders   CT CORONARY MORPH W/CTA COR W/SCORE W/CA W/CM &/OR WO/CM   Chest pain due to myocardial ischemia, unspecified ischemic chest pain type       Relevant Medications   metoprolol tartrate (LOPRESSOR) 25 MG tablet   Other Relevant Orders   CT CORONARY MORPH W/CTA COR W/SCORE W/CA W/CM &/OR WO/CM          Disposition:   Return in about 4 weeks (around 02/28/2023) for CCTA and f/u.    Total time spent: 35 minutes  Signed,  Adrian Blackwater, MD  01/31/2023 9:24 AM    [facility]

## 2023-01-31 NOTE — Addendum Note (Signed)
Addended by: Adrian Blackwater A on: 01/31/2023 09:24 AM   Modules accepted: Orders

## 2023-02-18 ENCOUNTER — Ambulatory Visit: Payer: 59

## 2023-02-18 DIAGNOSIS — I259 Chronic ischemic heart disease, unspecified: Secondary | ICD-10-CM | POA: Diagnosis not present

## 2023-02-18 DIAGNOSIS — R9430 Abnormal result of cardiovascular function study, unspecified: Secondary | ICD-10-CM | POA: Diagnosis not present

## 2023-02-18 DIAGNOSIS — E782 Mixed hyperlipidemia: Secondary | ICD-10-CM

## 2023-02-18 DIAGNOSIS — I340 Nonrheumatic mitral (valve) insufficiency: Secondary | ICD-10-CM

## 2023-02-18 DIAGNOSIS — I10 Essential (primary) hypertension: Secondary | ICD-10-CM

## 2023-02-18 DIAGNOSIS — R9439 Abnormal result of other cardiovascular function study: Secondary | ICD-10-CM

## 2023-02-18 DIAGNOSIS — R7302 Impaired glucose tolerance (oral): Secondary | ICD-10-CM

## 2023-02-18 MED ORDER — IOHEXOL 350 MG/ML SOLN
100.0000 mL | Freq: Once | INTRAVENOUS | Status: AC | PRN
  Administered 2023-02-18: 100 mL via INTRAVENOUS

## 2023-02-21 ENCOUNTER — Ambulatory Visit: Payer: 59 | Admitting: Cardiovascular Disease

## 2023-02-24 ENCOUNTER — Encounter: Payer: Self-pay | Admitting: Cardiovascular Disease

## 2023-02-24 ENCOUNTER — Ambulatory Visit: Payer: 59 | Admitting: Cardiovascular Disease

## 2023-02-24 VITALS — BP 132/80 | HR 74 | Ht 68.0 in | Wt 219.0 lb

## 2023-02-24 DIAGNOSIS — I10 Essential (primary) hypertension: Secondary | ICD-10-CM | POA: Diagnosis not present

## 2023-02-24 DIAGNOSIS — E782 Mixed hyperlipidemia: Secondary | ICD-10-CM | POA: Diagnosis not present

## 2023-02-24 DIAGNOSIS — R9439 Abnormal result of other cardiovascular function study: Secondary | ICD-10-CM

## 2023-02-24 DIAGNOSIS — I340 Nonrheumatic mitral (valve) insufficiency: Secondary | ICD-10-CM

## 2023-02-24 DIAGNOSIS — D1803 Hemangioma of intra-abdominal structures: Secondary | ICD-10-CM

## 2023-02-24 DIAGNOSIS — R7302 Impaired glucose tolerance (oral): Secondary | ICD-10-CM

## 2023-02-24 MED ORDER — ROSUVASTATIN CALCIUM 20 MG PO TABS
20.0000 mg | ORAL_TABLET | Freq: Every day | ORAL | 11 refills | Status: DC
Start: 2023-02-24 — End: 2023-04-04

## 2023-02-24 NOTE — Progress Notes (Signed)
Cardiology Office Note   Date:  02/24/2023   ID:  Amy Buckley, DOB Aug 20, 1970, MRN [id number]  PCP:  Amy Loveless, MD  Cardiologist:  Amy Blackwater, MD      History of Present Illness: Amy Buckley is a 52 y.o. female who presents for No chief complaint on file.   Doing fine, no chest pain or shortness of breath but concerned about the results of the CTA coronaries.      Past Medical History:  Diagnosis Date   BRCA negative 03/2018   MyRisk neg except GALNT12 VUS   Dysrhythmia    Family history of breast cancer    Hypercholesterolemia    Increased risk of breast cancer 03/2018   IBIS=29.2%     Past Surgical History:  Procedure Laterality Date   CERVICAL CERCLAGE     CESAREAN SECTION     COLONOSCOPY WITH PROPOFOL N/A 09/21/2021   Procedure: COLONOSCOPY WITH PROPOFOL;  Surgeon: Midge Minium, MD;  Location: [facility];  Service: Endoscopy;  Laterality: N/A;   COMBINED HYSTEROSCOPY DIAGNOSTIC / D&C     ESSURE TUBAL LIGATION       Current Outpatient Medications  Medication Sig Dispense Refill   rosuvastatin (CRESTOR) 20 MG tablet Take 1 tablet (20 mg total) by mouth daily. 30 tablet 11   amLODipine (NORVASC) 5 MG tablet Take 1 tablet (5 mg total) by mouth daily. 90 tablet 3   hydrochlorothiazide (HYDRODIURIL) 12.5 MG tablet Take 1 tablet (12.5 mg total) by mouth daily. 90 tablet 3   meloxicam (MOBIC) 15 MG tablet Take 1 tablet by mouth daily. (Patient not taking: Reported on 12/31/2022)     methocarbamol (ROBAXIN) 500 MG tablet Take 1 tablet (500 mg total) by mouth 2 (two) times daily. (Patient not taking: Reported on 12/31/2022) 20 tablet 0   metoprolol tartrate (LOPRESSOR) 25 MG tablet Take 1 tab night before and 1 tab 90 minutes prior to cta coronaries 60 tablet 11   naproxen (NAPROSYN) 500 MG tablet Take 1 tablet (500 mg total) by mouth 2 (two) times daily with a meal. (Patient not taking: Reported on 12/31/2022) 30 tablet 0   No current  facility-administered medications for this visit.    Allergies:   Patient has no known allergies.    Social History:   reports that she has never smoked. She has never used smokeless tobacco. She reports that she does not drink alcohol and does not use drugs.   Family History:  family history includes Breast cancer in her paternal aunt; Breast cancer (age of onset: 31) in her mother; Diabetes in her mother; Hypertension in her mother; Lung cancer (age of onset: 46) in her father.    ROS:     Review of Systems  Constitutional: Negative.   HENT: Negative.    Eyes: Negative.   Respiratory: Negative.    Gastrointestinal: Negative.   Genitourinary: Negative.   Musculoskeletal: Negative.   Skin: Negative.   Neurological: Negative.   Endo/Heme/Allergies: Negative.   Psychiatric/Behavioral: Negative.    All other systems reviewed and are negative.     All other systems are reviewed and negative.    PHYSICAL EXAM: VS:  BP 132/80   Pulse 74   Ht 5\' 8"  (1.727 m)   Wt 219 lb (99.3 kg)   SpO2 99%   BMI 33.30 kg/m  , BMI Body mass index is 33.3 kg/m. Last weight:  Wt Readings from Last 3 Encounters:  02/24/23 219 lb (99.3  kg)  01/31/23 222 lb 12.8 oz (101.1 kg)  01/10/23 223 lb 6.4 oz (101.3 kg)     Physical Exam Constitutional:      Appearance: Normal appearance.  Cardiovascular:     Rate and Rhythm: Normal rate and regular rhythm.     Heart sounds: Normal heart sounds.  Pulmonary:     Effort: Pulmonary effort is normal.     Breath sounds: Normal breath sounds.  Musculoskeletal:     Right lower leg: No edema.     Left lower leg: No edema.  Neurological:     Mental Status: She is alert.       EKG:   Recent Labs: 12/31/2022: ALT 19; BUN 11; Creatinine, Ser 0.80; Hemoglobin 12.7; Platelets 245; Potassium 4.4; Sodium 140; TSH 1.320    Lipid Panel    Component Value Date/Time   CHOL 148 12/31/2022 1015   TRIG 68 12/31/2022 1015   HDL 50 12/31/2022 1015    LDLCALC 84 12/31/2022 1015      Other studies Reviewed: Additional studies/ records that were reviewed today include:  Review of the above records demonstrates:       No data to display            ASSESSMENT AND PLAN:    ICD-10-CM   1. Liver hemangioma  D18.03 CT ABDOMEN PELVIS W CONTRAST   CT abdomen recomended by radiogist with contrast 3 phase.  Incidental finding of hemangioma of the liver on CCTA and recommended with and without contrast CT    2. Mixed hyperlipidemia  E78.2 rosuvastatin (CRESTOR) 20 MG tablet    CT ABDOMEN PELVIS W CONTRAST    3. Essential hypertension, benign  I10 CT ABDOMEN PELVIS W CONTRAST    4. Impaired glucose tolerance  R73.02 CT ABDOMEN PELVIS W CONTRAST    5. Nonrheumatic mitral valve regurgitation  I34.0 CT ABDOMEN PELVIS W CONTRAST    6. Abnormal nuclear stress test  R94.39 CT ABDOMEN PELVIS W CONTRAST   CCTA revealed 980 calcium score but normal coronaries.  LDL was on lipid panel 81 thus will increase Crestor to 20 mg from 10 mg since patient has high CS.       Problem List Items Addressed This Visit       Cardiovascular and Mediastinum   Essential hypertension, benign   Relevant Medications   rosuvastatin (CRESTOR) 20 MG tablet   Other Relevant Orders   CT ABDOMEN PELVIS W CONTRAST     Endocrine   Impaired glucose tolerance   Relevant Orders   CT ABDOMEN PELVIS W CONTRAST     Other   Mixed hyperlipidemia   Relevant Medications   rosuvastatin (CRESTOR) 20 MG tablet   Other Relevant Orders   CT ABDOMEN PELVIS W CONTRAST   Other Visit Diagnoses     Liver hemangioma    -  Primary   CT abdomen recomended by radiogist with contrast 3 phase.  Incidental finding of hemangioma of the liver on CCTA and recommended with and without contrast CT   Relevant Medications   rosuvastatin (CRESTOR) 20 MG tablet   Other Relevant Orders   CT ABDOMEN PELVIS W CONTRAST   Nonrheumatic mitral valve regurgitation       Relevant Medications    rosuvastatin (CRESTOR) 20 MG tablet   Other Relevant Orders   CT ABDOMEN PELVIS W CONTRAST   Abnormal nuclear stress test       CCTA revealed 980 calcium score but normal coronaries.  LDL was  on lipid panel 81 thus will increase Crestor to 20 mg from 10 mg since patient has high CS.   Relevant Orders   CT ABDOMEN PELVIS W CONTRAST          Disposition:   Return in about 4 weeks (around 03/24/2023) for ct abdomen and pelvis.    Total time spent: 30 minutes  Signed,  Amy Blackwater, MD  02/24/2023 11:11 AM    [facility]

## 2023-03-04 ENCOUNTER — Ambulatory Visit: Payer: 59

## 2023-03-04 DIAGNOSIS — D1803 Hemangioma of intra-abdominal structures: Secondary | ICD-10-CM

## 2023-03-04 DIAGNOSIS — R9439 Abnormal result of other cardiovascular function study: Secondary | ICD-10-CM

## 2023-03-04 DIAGNOSIS — R7302 Impaired glucose tolerance (oral): Secondary | ICD-10-CM

## 2023-03-04 DIAGNOSIS — I10 Essential (primary) hypertension: Secondary | ICD-10-CM

## 2023-03-04 DIAGNOSIS — I340 Nonrheumatic mitral (valve) insufficiency: Secondary | ICD-10-CM

## 2023-03-04 DIAGNOSIS — E782 Mixed hyperlipidemia: Secondary | ICD-10-CM

## 2023-03-04 MED ORDER — IOHEXOL 300 MG/ML  SOLN
100.0000 mL | Freq: Once | INTRAMUSCULAR | Status: AC | PRN
  Administered 2023-03-04: 100 mL via INTRAVENOUS

## 2023-03-25 ENCOUNTER — Encounter: Payer: Self-pay | Admitting: Obstetrics and Gynecology

## 2023-03-25 ENCOUNTER — Telehealth: Payer: Self-pay

## 2023-03-25 NOTE — Telephone Encounter (Signed)
Read pt Amy Buckley's msg to her.  Pt reassured.

## 2023-03-25 NOTE — Telephone Encounter (Signed)
Pt calling; has questions about her pap smear.  [phone]  (pap was last done 05/23/22; needs repeat done at annual  after 05/19/23) Left vm to return call or better yet to send mychart msg.

## 2023-03-31 ENCOUNTER — Ambulatory Visit: Payer: 59 | Admitting: Cardiovascular Disease

## 2023-03-31 ENCOUNTER — Encounter: Payer: Self-pay | Admitting: Cardiovascular Disease

## 2023-03-31 VITALS — BP 118/80 | HR 73 | Ht 68.0 in | Wt 219.6 lb

## 2023-03-31 DIAGNOSIS — E782 Mixed hyperlipidemia: Secondary | ICD-10-CM

## 2023-03-31 DIAGNOSIS — R001 Bradycardia, unspecified: Secondary | ICD-10-CM | POA: Diagnosis not present

## 2023-03-31 DIAGNOSIS — I10 Essential (primary) hypertension: Secondary | ICD-10-CM

## 2023-03-31 DIAGNOSIS — I340 Nonrheumatic mitral (valve) insufficiency: Secondary | ICD-10-CM

## 2023-03-31 DIAGNOSIS — I2510 Atherosclerotic heart disease of native coronary artery without angina pectoris: Secondary | ICD-10-CM

## 2023-03-31 NOTE — Progress Notes (Signed)
Cardiology Office Note   Date:  03/31/2023   ID:  Amy Buckley, DOB 09-26-1970, MRN [id number]  PCP:  Margaretann Loveless, MD  Cardiologist:  Adrian Blackwater, MD      History of Present Illness: Amy Buckley is a 52 y.o. female who presents for No chief complaint on file.   Feeling fine      Past Medical History:  Diagnosis Date   BRCA negative 03/2018   MyRisk neg except GALNT12 VUS   Dysrhythmia    Family history of breast cancer    Hypercholesterolemia    Increased risk of breast cancer 03/2018   IBIS=29.2%     Past Surgical History:  Procedure Laterality Date   CERVICAL CERCLAGE     CESAREAN SECTION     COLONOSCOPY WITH PROPOFOL N/A 09/21/2021   Procedure: COLONOSCOPY WITH PROPOFOL;  Surgeon: Midge Minium, MD;  Location: [facility];  Service: Endoscopy;  Laterality: N/A;   COMBINED HYSTEROSCOPY DIAGNOSTIC / D&C     ESSURE TUBAL LIGATION       Current Outpatient Medications  Medication Sig Dispense Refill   amLODipine (NORVASC) 5 MG tablet Take 1 tablet (5 mg total) by mouth daily. 90 tablet 3   hydrochlorothiazide (HYDRODIURIL) 12.5 MG tablet Take 1 tablet (12.5 mg total) by mouth daily. 90 tablet 3   naproxen (NAPROSYN) 500 MG tablet Take 1 tablet (500 mg total) by mouth 2 (two) times daily with a meal. (Patient not taking: Reported on 12/31/2022) 30 tablet 0   rosuvastatin (CRESTOR) 20 MG tablet Take 1 tablet (20 mg total) by mouth daily. 30 tablet 11   No current facility-administered medications for this visit.    Allergies:   Patient has no known allergies.    Social History:   reports that she has never smoked. She has never used smokeless tobacco. She reports that she does not drink alcohol and does not use drugs.   Family History:  family history includes Breast cancer in her paternal aunt; Breast cancer (age of onset: 61) in her mother; Diabetes in her mother; Hypertension in her mother; Lung cancer (age of onset: 50) in her father.     ROS:     Review of Systems  Constitutional: Negative.   HENT: Negative.    Eyes: Negative.   Respiratory: Negative.    Gastrointestinal: Negative.   Genitourinary: Negative.   Musculoskeletal: Negative.   Skin: Negative.   Neurological: Negative.   Endo/Heme/Allergies: Negative.   Psychiatric/Behavioral: Negative.    All other systems reviewed and are negative.     All other systems are reviewed and negative.    PHYSICAL EXAM: VS:  BP 118/80   Pulse 73   Ht 5\' 8"  (1.727 m)   Wt 219 lb 9.6 oz (99.6 kg)   SpO2 94%   BMI 33.39 kg/m  , BMI Body mass index is 33.39 kg/m. Last weight:  Wt Readings from Last 3 Encounters:  03/31/23 219 lb 9.6 oz (99.6 kg)  02/24/23 219 lb (99.3 kg)  01/31/23 222 lb 12.8 oz (101.1 kg)     Physical Exam Constitutional:      Appearance: Normal appearance.  Cardiovascular:     Rate and Rhythm: Normal rate and regular rhythm.     Heart sounds: Normal heart sounds.  Pulmonary:     Effort: Pulmonary effort is normal.     Breath sounds: Normal breath sounds.  Musculoskeletal:     Right lower leg: No edema.  Left lower leg: No edema.  Neurological:     Mental Status: She is alert.       EKG:   Recent Labs: 12/31/2022: ALT 19; BUN 11; Creatinine, Ser 0.80; Hemoglobin 12.7; Platelets 245; Potassium 4.4; Sodium 140; TSH 1.320    Lipid Panel    Component Value Date/Time   CHOL 148 12/31/2022 1015   TRIG 68 12/31/2022 1015   HDL 50 12/31/2022 1015   LDLCALC 84 12/31/2022 1015      Other studies Reviewed: Additional studies/ records that were reviewed today include:  Review of the above records demonstrates:       No data to display            ASSESSMENT AND PLAN:    ICD-10-CM   1. Essential hypertension, benign  I10     2. Mixed hyperlipidemia  E78.2     3. Bradycardia  R00.1     4. Nonrheumatic mitral valve regurgitation  I34.0     5. Coronary artery disease involving native coronary artery of native  heart without angina pectoris  I25.10    ca 983, without any significant disease       Problem List Items Addressed This Visit       Cardiovascular and Mediastinum   Essential hypertension, benign - Primary     Other   Mixed hyperlipidemia   Bradycardia   Other Visit Diagnoses     Nonrheumatic mitral valve regurgitation       Coronary artery disease involving native coronary artery of native heart without angina pectoris       ca 983, without any significant disease          Disposition:   Return in about 3 months (around 07/01/2023).    Total time spent: 35 minutes  Signed,  Adrian Blackwater, MD  03/31/2023 9:39 AM    [facility]

## 2023-04-03 ENCOUNTER — Ambulatory Visit: Payer: 59 | Admitting: Internal Medicine

## 2023-04-04 ENCOUNTER — Encounter: Payer: Self-pay | Admitting: Internal Medicine

## 2023-04-04 ENCOUNTER — Ambulatory Visit: Payer: 59 | Admitting: Internal Medicine

## 2023-04-04 VITALS — BP 124/86 | HR 60 | Ht 68.0 in | Wt 217.8 lb

## 2023-04-04 DIAGNOSIS — I10 Essential (primary) hypertension: Secondary | ICD-10-CM

## 2023-04-04 DIAGNOSIS — E782 Mixed hyperlipidemia: Secondary | ICD-10-CM | POA: Diagnosis not present

## 2023-04-04 DIAGNOSIS — R7303 Prediabetes: Secondary | ICD-10-CM

## 2023-04-04 DIAGNOSIS — Z23 Encounter for immunization: Secondary | ICD-10-CM | POA: Diagnosis not present

## 2023-04-04 MED ORDER — ROSUVASTATIN CALCIUM 20 MG PO TABS: 20.0000 mg | ORAL_TABLET | Freq: Every day | ORAL | 3 refills | Status: DC

## 2023-04-04 MED ORDER — HYDROCHLOROTHIAZIDE 12.5 MG PO TABS
12.5000 mg | ORAL_TABLET | Freq: Every day | ORAL | 3 refills | Status: DC
Start: 2023-04-04 — End: 2024-02-02

## 2023-04-04 MED ORDER — AMLODIPINE BESYLATE 5 MG PO TABS
5.0000 mg | ORAL_TABLET | Freq: Every day | ORAL | 3 refills | Status: DC
Start: 2023-04-04 — End: 2024-02-05

## 2023-04-04 NOTE — Progress Notes (Signed)
Established Patient Office Visit  Subjective:  Patient ID: Amy Buckley, female    DOB: 1971-04-16  Age: 52 y.o. MRN: [id number]  Chief Complaint  Patient presents with   Follow-up    3 month follow up    Patient is here for her follow-up.  Her blood pressure is looking good on medications and she is slowly losing weight with diet and exercise.  Denies chest pain, no shortness of breath and no palpitations.  She recently had a cardiac workup and a calcium score was found to be high.  She is now on a higher dose of Crestor. Needs labs. Also flu vaccine today.    No other concerns at this time.   Past Medical History:  Diagnosis Date   BRCA negative 03/2018   MyRisk neg except GALNT12 VUS   Dysrhythmia    Family history of breast cancer    Hypercholesterolemia    Increased risk of breast cancer 03/2018   IBIS=29.2%    Past Surgical History:  Procedure Laterality Date   CERVICAL CERCLAGE     CESAREAN SECTION     COLONOSCOPY WITH PROPOFOL N/A 09/21/2021   Procedure: COLONOSCOPY WITH PROPOFOL;  Surgeon: Midge Minium, MD;  Location: [facility];  Service: Endoscopy;  Laterality: N/A;   COMBINED HYSTEROSCOPY DIAGNOSTIC / D&C     ESSURE TUBAL LIGATION      Social History   Socioeconomic History   Marital status: Married    Spouse name: Not on file   Number of children: Not on file   Years of education: Not on file   Highest education level: Not on file  Occupational History   Not on file  Tobacco Use   Smoking status: Never   Smokeless tobacco: Never  Vaping Use   Vaping status: Never Used  Substance and Sexual Activity   Alcohol use: No   Drug use: No   Sexual activity: Yes    Birth control/protection: I.U.D.  Other Topics Concern   Not on file  Social History Narrative   Not on file   Social Determinants of Health   Financial Resource Strain: Not on file  Food Insecurity: Not on file  Transportation Needs: Not on file  Physical Activity:  Not on file  Stress: Not on file  Social Connections: Not on file  Intimate Partner Violence: Not on file    Family History  Problem Relation Age of Onset   Diabetes Mother    Hypertension Mother    Breast cancer Mother 6   Lung cancer Father 71   Breast cancer Paternal Aunt     No Known Allergies  Review of Systems  Constitutional: Negative.  Negative for chills, fever, malaise/fatigue and weight loss.  HENT: Negative.    Eyes: Negative.   Respiratory: Negative.  Negative for cough and shortness of breath.   Cardiovascular: Negative.  Negative for chest pain, palpitations and leg swelling.  Gastrointestinal: Negative.  Negative for abdominal pain, constipation, diarrhea, heartburn, nausea and vomiting.  Genitourinary: Negative.  Negative for dysuria and flank pain.  Musculoskeletal: Negative.  Negative for joint pain and myalgias.  Skin: Negative.   Neurological: Negative.  Negative for dizziness and headaches.  Endo/Heme/Allergies: Negative.   Psychiatric/Behavioral: Negative.  Negative for depression and suicidal ideas. The patient is not nervous/anxious.        Objective:   BP 124/86   Pulse 60   Ht 5\' 8"  (1.727 m)   Wt 217 lb 12.8 oz (98.8 kg)  SpO2 92%   BMI 33.12 kg/m   Vitals:   04/04/23 1006  BP: 124/86  Pulse: 60  Height: 5\' 8"  (1.727 m)  Weight: 217 lb 12.8 oz (98.8 kg)  SpO2: 92%  BMI (Calculated): 33.12    Physical Exam Vitals and nursing note reviewed.  Constitutional:      Appearance: Normal appearance.  HENT:     Head: Normocephalic and atraumatic.     Nose: Nose normal.     Mouth/Throat:     Mouth: Mucous membranes are moist.     Pharynx: Oropharynx is clear.  Eyes:     Conjunctiva/sclera: Conjunctivae normal.     Pupils: Pupils are equal, round, and reactive to light.  Cardiovascular:     Rate and Rhythm: Normal rate and regular rhythm.     Pulses: Normal pulses.     Heart sounds: Normal heart sounds. No murmur  heard. Pulmonary:     Effort: Pulmonary effort is normal.     Breath sounds: Normal breath sounds. No wheezing.  Abdominal:     General: Bowel sounds are normal.     Palpations: Abdomen is soft.     Tenderness: There is no abdominal tenderness. There is no right CVA tenderness or left CVA tenderness.  Musculoskeletal:        General: Normal range of motion.     Cervical back: Normal range of motion.     Right lower leg: No edema.     Left lower leg: No edema.  Skin:    General: Skin is warm and dry.  Neurological:     General: No focal deficit present.     Mental Status: She is alert and oriented to person, place, and time.  Psychiatric:        Mood and Affect: Mood normal.        Behavior: Behavior normal.      No results found for any visits on 04/04/23.  No results found for this or any previous visit (from the past 2160 hour(s)).    Assessment & Plan:  Continue all medications. Fasting labs. Flu vaccine. Problem List Items Addressed This Visit     Mixed hyperlipidemia   Relevant Medications   hydrochlorothiazide (HYDRODIURIL) 12.5 MG tablet   rosuvastatin (CRESTOR) 20 MG tablet   amLODipine (NORVASC) 5 MG tablet   Other Relevant Orders   Lipid Panel w/o Chol/HDL Ratio   Essential hypertension, benign - Primary   Relevant Medications   hydrochlorothiazide (HYDRODIURIL) 12.5 MG tablet   rosuvastatin (CRESTOR) 20 MG tablet   amLODipine (NORVASC) 5 MG tablet   Other Relevant Orders   CMP14+EGFR   Prediabetes   Relevant Orders   Hemoglobin A1c   Other Visit Diagnoses     Need for immunization against influenza       Relevant Orders   Influenza, MDCK, trivalent, PF(Flucelvax egg-free) (Completed)       Return in about 3 months (around 07/05/2023).   Total time spent: 30 minutes  Margaretann Loveless, MD  04/04/2023   This document may have been prepared by [facility] Voice Recognition software and as such may include unintentional dictation errors.

## 2023-04-05 LAB — CMP14+EGFR
ALT: 14 [IU]/L (ref 0–32)
AST: 18 [IU]/L (ref 0–40)
Albumin: 4.1 g/dL (ref 3.8–4.9)
Alkaline Phosphatase: 86 [IU]/L (ref 44–121)
BUN/Creatinine Ratio: 13 (ref 9–23)
BUN: 11 mg/dL (ref 6–24)
Bilirubin Total: 0.5 mg/dL (ref 0.0–1.2)
CO2: 29 mmol/L (ref 20–29)
Calcium: 9.6 mg/dL (ref 8.7–10.2)
Chloride: 99 mmol/L (ref 96–106)
Creatinine, Ser: 0.88 mg/dL (ref 0.57–1.00)
Globulin, Total: 2.9 g/dL (ref 1.5–4.5)
Glucose: 87 mg/dL (ref 70–99)
Potassium: 4.6 mmol/L (ref 3.5–5.2)
Sodium: 140 mmol/L (ref 134–144)
Total Protein: 7 g/dL (ref 6.0–8.5)
eGFR: 80 mL/min/{1.73_m2} (ref >59)

## 2023-04-05 LAB — LIPID PANEL W/O CHOL/HDL RATIO
Cholesterol, Total: 133 mg/dL (ref 100–199)
HDL: 53 mg/dL (ref >39)
LDL Chol Calc (NIH): 70 mg/dL (ref 0–99)
Triglycerides: 43 mg/dL (ref 0–149)
VLDL Cholesterol Cal: 10 mg/dL (ref 5–40)

## 2023-04-05 LAB — HEMOGLOBIN A1C
Est. average glucose Bld gHb Est-mCnc: 134 mg/dL
Hgb A1c MFr Bld: 6.3 % — ABNORMAL HIGH (ref 4.8–5.6)

## 2023-05-26 ENCOUNTER — Ambulatory Visit: Payer: 59 | Admitting: Certified Nurse Midwife

## 2023-05-30 ENCOUNTER — Other Ambulatory Visit: Payer: Self-pay | Admitting: Obstetrics and Gynecology

## 2023-05-30 DIAGNOSIS — Z1231 Encounter for screening mammogram for malignant neoplasm of breast: Secondary | ICD-10-CM

## 2023-06-03 ENCOUNTER — Other Ambulatory Visit: Payer: Self-pay | Admitting: Obstetrics and Gynecology

## 2023-06-03 DIAGNOSIS — Z9189 Other specified personal risk factors, not elsewhere classified: Secondary | ICD-10-CM

## 2023-07-01 ENCOUNTER — Ambulatory Visit: Payer: 59 | Admitting: Cardiovascular Disease

## 2023-07-07 ENCOUNTER — Ambulatory Visit: Payer: 59 | Admitting: Internal Medicine

## 2023-07-10 ENCOUNTER — Ambulatory Visit
Admission: RE | Admit: 2023-07-10 | Discharge: 2023-07-10 | Disposition: A | Discharge status: Home / Self Care | Payer: 59 | Source: Ambulatory Visit | Attending: Obstetrics and Gynecology | Admitting: Obstetrics and Gynecology

## 2023-07-10 DIAGNOSIS — Z1231 Encounter for screening mammogram for malignant neoplasm of breast: Secondary | ICD-10-CM | POA: Insufficient documentation

## 2023-07-29 ENCOUNTER — Ambulatory Visit: Payer: 59 | Admitting: Cardiovascular Disease

## 2023-08-05 ENCOUNTER — Encounter: Payer: Self-pay | Admitting: Internal Medicine

## 2023-08-05 ENCOUNTER — Ambulatory Visit: Payer: 59 | Admitting: Internal Medicine

## 2023-08-05 VITALS — BP 116/78 | HR 77 | Ht 68.0 in | Wt 215.0 lb

## 2023-08-05 DIAGNOSIS — I10 Essential (primary) hypertension: Secondary | ICD-10-CM | POA: Diagnosis not present

## 2023-08-05 DIAGNOSIS — R7303 Prediabetes: Secondary | ICD-10-CM

## 2023-08-05 DIAGNOSIS — J301 Allergic rhinitis due to pollen: Secondary | ICD-10-CM | POA: Diagnosis not present

## 2023-08-05 DIAGNOSIS — E782 Mixed hyperlipidemia: Secondary | ICD-10-CM

## 2023-08-05 NOTE — Progress Notes (Signed)
 Established Patient Office Visit  Subjective:  Patient ID: Amy Buckley, female    DOB: 1971/05/08  Age: 53 y.o. MRN: [id number]  Chief Complaint  Patient presents with   Follow-up    3 month follow up    Patient comes in for her follow-up today.  She has been feeling well until yesterday she started having a mild sore throat and some nasal congestion.  No fevers or chills, no bodyaches no productive cough.  On exam her throat is clear, ears are clear.  Suspect allergies, advised to start Claritin and nasal steaming.  Patient will call if she develops any further symptoms or colored sputum. Patient is fasting for blood work today.  Trying to lose weight with diet and exercise.    No other concerns at this time.   Past Medical History:  Diagnosis Date   BRCA negative 03/2018   MyRisk neg except GALNT12 VUS   Dysrhythmia    Family history of breast cancer    Hypercholesterolemia    Increased risk of breast cancer 03/2018   IBIS=29.2%    Past Surgical History:  Procedure Laterality Date   CERVICAL CERCLAGE     CESAREAN SECTION     COLONOSCOPY WITH PROPOFOL N/A 09/21/2021   Procedure: COLONOSCOPY WITH PROPOFOL;  Surgeon: Midge Minium, MD;  Location: [facility];  Service: Endoscopy;  Laterality: N/A;   COMBINED HYSTEROSCOPY DIAGNOSTIC / D&C     ESSURE TUBAL LIGATION      Social History   Socioeconomic History   Marital status: Married    Spouse name: Not on file   Number of children: Not on file   Years of education: Not on file   Highest education level: Not on file  Occupational History   Not on file  Tobacco Use   Smoking status: Never   Smokeless tobacco: Never  Vaping Use   Vaping status: Never Used  Substance and Sexual Activity   Alcohol use: No   Drug use: No   Sexual activity: Yes    Birth control/protection: I.U.D.  Other Topics Concern   Not on file  Social History Narrative   Not on file   Social Drivers of Health   Financial  Resource Strain: Low Risk  (05/30/2023)   Received from [facility]   Overall Financial Resource Strain (CARDIA)    Difficulty of Paying Living Expenses: Not very hard  Food Insecurity: No Food Insecurity (05/30/2023)   Received from [facility]   Hunger Vital Sign    Worried About Running Out of Food in the Last Year: Never true    Ran Out of Food in the Last Year: Never true  Transportation Needs: No Transportation Needs (05/30/2023)   Received from [facility] - Transportation    In the past 12 months, has lack of transportation kept you from medical appointments or from getting medications?: No    Lack of Transportation (Non-Medical): No  Physical Activity: Not on file  Stress: Not on file  Social Connections: Not on file  Intimate Partner Violence: Not on file    Family History  Problem Relation Age of Onset   Diabetes Mother    Hypertension Mother    Breast cancer Mother 89   Lung cancer Father 33   Breast cancer Paternal Aunt     No Known Allergies  Outpatient Medications Prior to Visit  Medication Sig   amLODipine (NORVASC) 5 MG tablet Take  1 tablet (5 mg total) by mouth daily.   hydrochlorothiazide (HYDRODIURIL) 12.5 MG tablet Take 1 tablet (12.5 mg total) by mouth daily.   rosuvastatin (CRESTOR) 20 MG tablet Take 1 tablet (20 mg total) by mouth daily.   estradiol (ESTRACE) 0.1 MG/GM vaginal cream Place 1 Applicatorful vaginally 2 (two) times a week. (Patient not taking: Reported on 08/05/2023)   naproxen (NAPROSYN) 500 MG tablet Take 1 tablet (500 mg total) by mouth 2 (two) times daily with a meal. (Patient not taking: Reported on 08/05/2023)   No facility-administered medications prior to visit.    Review of Systems  Constitutional: Negative.  Negative for chills, fever, malaise/fatigue and weight loss.  HENT:  Positive for congestion and sore throat. Negative for sinus pain.   Eyes: Negative.    Respiratory: Negative.  Negative for cough, sputum production, shortness of breath and wheezing.   Cardiovascular: Negative.  Negative for chest pain, palpitations and leg swelling.  Gastrointestinal: Negative.  Negative for abdominal pain, constipation, diarrhea, heartburn, nausea and vomiting.  Genitourinary: Negative.  Negative for dysuria and flank pain.  Musculoskeletal: Negative.  Negative for joint pain and myalgias.  Skin: Negative.   Neurological: Negative.  Negative for dizziness and headaches.  Endo/Heme/Allergies: Negative.   Psychiatric/Behavioral: Negative.  Negative for depression and suicidal ideas. The patient is not nervous/anxious.        Objective:   BP 116/78   Pulse 77   Ht 5\' 8"  (1.727 m)   Wt 215 lb (97.5 kg)   SpO2 99%   BMI 32.69 kg/m   Vitals:   08/05/23 0929  BP: 116/78  Pulse: 77  Height: 5\' 8"  (1.727 m)  Weight: 215 lb (97.5 kg)  SpO2: 99%  BMI (Calculated): 32.7    Physical Exam Vitals and nursing note reviewed.  Constitutional:      Appearance: Normal appearance.  HENT:     Head: Normocephalic and atraumatic.     Nose: Nose normal.     Mouth/Throat:     Mouth: Mucous membranes are moist.     Pharynx: Oropharynx is clear.  Eyes:     Conjunctiva/sclera: Conjunctivae normal.     Pupils: Pupils are equal, round, and reactive to light.  Cardiovascular:     Rate and Rhythm: Normal rate and regular rhythm.     Pulses: Normal pulses.     Heart sounds: Normal heart sounds. No murmur heard. Pulmonary:     Effort: Pulmonary effort is normal.     Breath sounds: Normal breath sounds. No wheezing.  Abdominal:     General: Bowel sounds are normal.     Palpations: Abdomen is soft.     Tenderness: There is no abdominal tenderness. There is no right CVA tenderness or left CVA tenderness.  Musculoskeletal:        General: Normal range of motion.     Cervical back: Normal range of motion.     Right lower leg: No edema.     Left lower leg: No  edema.  Skin:    General: Skin is warm and dry.  Neurological:     General: No focal deficit present.     Mental Status: She is alert and oriented to person, place, and time.  Psychiatric:        Mood and Affect: Mood normal.        Behavior: Behavior normal.      No results found for any visits on 08/05/23.  No results found for this or any  previous visit (from the past 2160 hours).    Assessment & Plan:  Patient advised to continue medications.  Check labs.  Monitor blood pressure at home. Problem List Items Addressed This Visit     Mixed hyperlipidemia   Relevant Orders   CMP14+EGFR   Lipid Panel w/o Chol/HDL Ratio   Essential hypertension, benign - Primary   Prediabetes   Relevant Orders   Hemoglobin A1c   Other Visit Diagnoses       Seasonal allergic rhinitis due to pollen           Return in about 4 months (around 12/05/2023).   Total time spent: 30 minutes  Margaretann Loveless, MD  08/05/2023   This document may have been prepared by [facility] Voice Recognition software and as such may include unintentional dictation errors.

## 2023-08-06 LAB — CMP14+EGFR
ALT: 15 IU/L (ref 0–32)
AST: 19 IU/L (ref 0–40)
Albumin: 4.1 g/dL (ref 3.8–4.9)
Alkaline Phosphatase: 79 IU/L (ref 44–121)
BUN/Creatinine Ratio: 13 (ref 9–23)
BUN: 10 mg/dL (ref 6–24)
Bilirubin Total: 0.4 mg/dL (ref 0.0–1.2)
CO2: 26 mmol/L (ref 20–29)
Calcium: 8.9 mg/dL (ref 8.7–10.2)
Chloride: 100 mmol/L (ref 96–106)
Creatinine, Ser: 0.75 mg/dL (ref 0.57–1.00)
Globulin, Total: 2.7 g/dL (ref 1.5–4.5)
Glucose: 84 mg/dL (ref 70–99)
Potassium: 4.1 mmol/L (ref 3.5–5.2)
Sodium: 140 mmol/L (ref 134–144)
Total Protein: 6.8 g/dL (ref 6.0–8.5)
eGFR: 96 mL/min/{1.73_m2} (ref >59)

## 2023-08-06 LAB — LIPID PANEL W/O CHOL/HDL RATIO
Cholesterol, Total: 136 mg/dL (ref 100–199)
HDL: 45 mg/dL (ref >39)
LDL Chol Calc (NIH): 79 mg/dL (ref 0–99)
Triglycerides: 55 mg/dL (ref 0–149)
VLDL Cholesterol Cal: 12 mg/dL (ref 5–40)

## 2023-08-06 LAB — HEMOGLOBIN A1C
Est. average glucose Bld gHb Est-mCnc: 126 mg/dL
Hgb A1c MFr Bld: 6 % — ABNORMAL HIGH (ref 4.8–5.6)

## 2023-08-08 ENCOUNTER — Ambulatory Visit: Payer: 59 | Admitting: Cardiovascular Disease

## 2023-08-08 ENCOUNTER — Encounter: Payer: Self-pay | Admitting: Cardiovascular Disease

## 2023-08-08 VITALS — BP 113/72 | HR 65 | Ht 68.0 in | Wt 209.0 lb

## 2023-08-08 DIAGNOSIS — I2510 Atherosclerotic heart disease of native coronary artery without angina pectoris: Secondary | ICD-10-CM

## 2023-08-08 DIAGNOSIS — I340 Nonrheumatic mitral (valve) insufficiency: Secondary | ICD-10-CM

## 2023-08-08 DIAGNOSIS — R001 Bradycardia, unspecified: Secondary | ICD-10-CM

## 2023-08-08 DIAGNOSIS — E782 Mixed hyperlipidemia: Secondary | ICD-10-CM

## 2023-08-08 DIAGNOSIS — I10 Essential (primary) hypertension: Secondary | ICD-10-CM | POA: Diagnosis not present

## 2023-08-08 NOTE — Progress Notes (Signed)
 Cardiology Office Note   Date:  08/08/2023   ID:  Amy Buckley, DOB 1970/12/24, MRN [id number]  PCP:  Margaretann Loveless, MD  Cardiologist:  Adrian Blackwater, MD      History of Present Illness: Amy Buckley is a 53 y.o. female who presents for  Chief Complaint  Patient presents with   Follow-up    3 month follow up    Doing well      Past Medical History:  Diagnosis Date   BRCA negative 03/2018   MyRisk neg except GALNT12 VUS   Dysrhythmia    Family history of breast cancer    Hypercholesterolemia    Increased risk of breast cancer 03/2018   IBIS=29.2%     Past Surgical History:  Procedure Laterality Date   CERVICAL CERCLAGE     CESAREAN SECTION     COLONOSCOPY WITH PROPOFOL N/A 09/21/2021   Procedure: COLONOSCOPY WITH PROPOFOL;  Surgeon: Midge Minium, MD;  Location: [facility];  Service: Endoscopy;  Laterality: N/A;   COMBINED HYSTEROSCOPY DIAGNOSTIC / D&C     ESSURE TUBAL LIGATION       Current Outpatient Medications  Medication Sig Dispense Refill   amLODipine (NORVASC) 5 MG tablet Take 1 tablet (5 mg total) by mouth daily. 90 tablet 3   estradiol (ESTRACE) 0.1 MG/GM vaginal cream Place 1 Applicatorful vaginally 2 (two) times a week. (Patient not taking: Reported on 08/05/2023)     hydrochlorothiazide (HYDRODIURIL) 12.5 MG tablet Take 1 tablet (12.5 mg total) by mouth daily. 90 tablet 3   naproxen (NAPROSYN) 500 MG tablet Take 1 tablet (500 mg total) by mouth 2 (two) times daily with a meal. (Patient not taking: Reported on 08/05/2023) 30 tablet 0   rosuvastatin (CRESTOR) 20 MG tablet Take 1 tablet (20 mg total) by mouth daily. 90 tablet 3   No current facility-administered medications for this visit.    Allergies:   Patient has no known allergies.    Social History:   reports that she has never smoked. She has never used smokeless tobacco. She reports that she does not drink alcohol and does not use drugs.   Family History:  family  history includes Breast cancer in her paternal aunt; Breast cancer (age of onset: 67) in her mother; Diabetes in her mother; Hypertension in her mother; Lung cancer (age of onset: 47) in her father.    ROS:     Review of Systems  Constitutional: Negative.   HENT: Negative.    Eyes: Negative.   Respiratory: Negative.    Gastrointestinal: Negative.   Genitourinary: Negative.   Musculoskeletal: Negative.   Skin: Negative.   Neurological: Negative.   Endo/Heme/Allergies: Negative.   Psychiatric/Behavioral: Negative.    All other systems reviewed and are negative.     All other systems are reviewed and negative.    PHYSICAL EXAM: VS:  BP 113/72   Pulse 65   Ht 5\' 8"  (1.727 m)   Wt 209 lb (94.8 kg)   SpO2 99%   BMI 31.78 kg/m  , BMI Body mass index is 31.78 kg/m. Last weight:  Wt Readings from Last 3 Encounters:  08/08/23 209 lb (94.8 kg)  08/05/23 215 lb (97.5 kg)  04/04/23 217 lb 12.8 oz (98.8 kg)     Physical Exam Constitutional:      Appearance: Normal appearance.  Cardiovascular:     Rate and Rhythm: Normal rate and regular rhythm.     Heart sounds: Normal heart  sounds.  Pulmonary:     Effort: Pulmonary effort is normal.     Breath sounds: Normal breath sounds.  Musculoskeletal:     Right lower leg: No edema.     Left lower leg: No edema.  Neurological:     Mental Status: She is alert.       EKG:   Recent Labs: 12/31/2022: Hemoglobin 12.7; Platelets 245; TSH 1.320 08/05/2023: ALT 15; BUN 10; Creatinine, Ser 0.75; Potassium 4.1; Sodium 140    Lipid Panel    Component Value Date/Time   CHOL 136 08/05/2023 1014   TRIG 55 08/05/2023 1014   HDL 45 08/05/2023 1014   LDLCALC 79 08/05/2023 1014      Other studies Reviewed: Additional studies/ records that were reviewed today include:  Review of the above records demonstrates:       No data to display            ASSESSMENT AND PLAN:    ICD-10-CM   1. Essential hypertension, benign  I10      2. Mixed hyperlipidemia  E78.2    LDL 79, ca score 900, but do diet need 55-70    3. Nonrheumatic mitral valve regurgitation  I34.0     4. Bradycardia  R00.1     5. Coronary artery disease involving native coronary artery of native heart without angina pectoris  I25.10    no chest pain       Problem List Items Addressed This Visit       Cardiovascular and Mediastinum   Essential hypertension, benign - Primary     Other   Mixed hyperlipidemia   Bradycardia   Other Visit Diagnoses       Nonrheumatic mitral valve regurgitation         Coronary artery disease involving native coronary artery of native heart without angina pectoris       no chest pain          Disposition:   Return in about 3 months (around 11/08/2023).    Total time spent: 30 minutes  Signed,  Adrian Blackwater, MD  08/08/2023 10:02 AM    [facility]

## 2023-08-12 NOTE — Progress Notes (Signed)
 Patient notified

## 2023-11-10 ENCOUNTER — Ambulatory Visit: Admitting: Cardiovascular Disease

## 2023-12-05 ENCOUNTER — Ambulatory Visit: Admitting: Internal Medicine

## 2023-12-08 ENCOUNTER — Encounter: Payer: Self-pay | Admitting: Internal Medicine

## 2023-12-08 ENCOUNTER — Ambulatory Visit: Admitting: Internal Medicine

## 2023-12-08 VITALS — BP 138/84 | HR 77 | Ht 68.0 in | Wt 221.2 lb

## 2023-12-08 DIAGNOSIS — I10 Essential (primary) hypertension: Secondary | ICD-10-CM

## 2023-12-08 DIAGNOSIS — R7303 Prediabetes: Secondary | ICD-10-CM | POA: Diagnosis not present

## 2023-12-08 DIAGNOSIS — R635 Abnormal weight gain: Secondary | ICD-10-CM

## 2023-12-08 DIAGNOSIS — E782 Mixed hyperlipidemia: Secondary | ICD-10-CM | POA: Diagnosis not present

## 2023-12-08 NOTE — Progress Notes (Signed)
 Established Patient Office Visit  Subjective:  Patient ID: Amy Buckley, female    DOB: 1971/02/20  Age: 53 y.o. MRN: [id number]  Chief Complaint  Patient presents with   Follow-up    4 month follow up    Patient comes in for her 57-month follow-up.  She is feeling well but has gained weight and her blood pressure is higher than before.  She admits to dietary noncompliance as well as lack of exercise due to heat.  However patient is motivated now to resume her diet control as well as exercise regimen.  Denies headaches or dizziness, no nausea vomiting and no chest pain or palpitations.    No other concerns at this time.   Past Medical History:  Diagnosis Date   BRCA negative 03/2018   MyRisk neg except GALNT12 VUS   Dysrhythmia    Family history of breast cancer    Hypercholesterolemia    Increased risk of breast cancer 03/2018   IBIS=29.2%    Past Surgical History:  Procedure Laterality Date   CERVICAL CERCLAGE     CESAREAN SECTION     COLONOSCOPY WITH PROPOFOL  N/A 09/21/2021   Procedure: COLONOSCOPY WITH PROPOFOL ;  Surgeon: Jinny Carmine, MD;  Location: [facility];  Service: Endoscopy;  Laterality: N/A;   COMBINED HYSTEROSCOPY DIAGNOSTIC / D&C     ESSURE TUBAL LIGATION      Social History   Socioeconomic History   Marital status: Married    Spouse name: Not on file   Number of children: Not on file   Years of education: Not on file   Highest education level: Not on file  Occupational History   Not on file  Tobacco Use   Smoking status: Never   Smokeless tobacco: Never  Vaping Use   Vaping status: Never Used  Substance and Sexual Activity   Alcohol use: No   Drug use: No   Sexual activity: Yes    Birth control/protection: I.U.D.  Other Topics Concern   Not on file  Social History Narrative   Not on file   Social Drivers of Health   Financial Resource Strain: Low Risk  (05/30/2023)   Received from [facility]   Overall  Financial Resource Strain (CARDIA)    Difficulty of Paying Living Expenses: Not very hard  Food Insecurity: No Food Insecurity (05/30/2023)   Received from [facility]   Hunger Vital Sign    Within the past 12 months, you worried that your food would run out before you got the money to buy more.: Never true    Within the past 12 months, the food you bought just didn't last and you didn't have money to get more.: Never true  Transportation Needs: No Transportation Needs (05/30/2023)   Received from [facility] - Transportation    In the past 12 months, has lack of transportation kept you from medical appointments or from getting medications?: No    Lack of Transportation (Non-Medical): No  Physical Activity: Not on file  Stress: Not on file  Social Connections: Not on file  Intimate Partner Violence: Not on file    Family History  Problem Relation Age of Onset   Diabetes Mother    Hypertension Mother    Breast cancer Mother 63   Lung cancer Father 52   Breast cancer Paternal Aunt     No Known Allergies  Outpatient Medications Prior to Visit  Medication Sig  amLODipine  (NORVASC ) 5 MG tablet Take 1 tablet (5 mg total) by mouth daily.   hydrochlorothiazide  (HYDRODIURIL ) 12.5 MG tablet Take 1 tablet (12.5 mg total) by mouth daily.   rosuvastatin  (CRESTOR ) 20 MG tablet Take 1 tablet (20 mg total) by mouth daily.   estradiol (ESTRACE) 0.1 MG/GM vaginal cream Place 1 Applicatorful vaginally 2 (two) times a week. (Patient not taking: Reported on 12/08/2023)   naproxen  (NAPROSYN ) 500 MG tablet Take 1 tablet (500 mg total) by mouth 2 (two) times daily with a meal. (Patient not taking: Reported on 12/08/2023)   No facility-administered medications prior to visit.    Review of Systems  Constitutional: Negative.  Negative for chills and weight loss.  HENT: Negative.  Negative for sore throat.   Eyes: Negative.   Respiratory: Negative.  Negative  for cough and shortness of breath.   Cardiovascular: Negative.  Negative for chest pain, palpitations and leg swelling.  Gastrointestinal: Negative.  Negative for abdominal pain, constipation, diarrhea, heartburn, nausea and vomiting.  Genitourinary: Negative.  Negative for dysuria and flank pain.  Musculoskeletal: Negative.  Negative for joint pain and myalgias.  Skin: Negative.   Neurological: Negative.  Negative for dizziness, tingling, tremors and headaches.  Endo/Heme/Allergies: Negative.   Psychiatric/Behavioral: Negative.  Negative for depression and suicidal ideas. The patient is not nervous/anxious.        Objective:   BP 138/84   Pulse 77   Ht 5' 8 (1.727 m)   Wt 221 lb 3.2 oz (100.3 kg)   SpO2 99%   BMI 33.63 kg/m   Vitals:   12/08/23 1511  BP: 138/84  Pulse: 77  Height: 5' 8 (1.727 m)  Weight: 221 lb 3.2 oz (100.3 kg)  SpO2: 99%  BMI (Calculated): 33.64    Physical Exam Vitals and nursing note reviewed.  Constitutional:      Appearance: Normal appearance.  HENT:     Head: Normocephalic and atraumatic.     Nose: Nose normal.     Mouth/Throat:     Mouth: Mucous membranes are moist.     Pharynx: Oropharynx is clear.  Eyes:     Conjunctiva/sclera: Conjunctivae normal.     Pupils: Pupils are equal, round, and reactive to light.  Cardiovascular:     Rate and Rhythm: Normal rate and regular rhythm.     Pulses: Normal pulses.     Heart sounds: Normal heart sounds. No murmur heard. Pulmonary:     Effort: Pulmonary effort is normal.     Breath sounds: Normal breath sounds. No wheezing.  Abdominal:     General: Bowel sounds are normal.     Palpations: Abdomen is soft.     Tenderness: There is no abdominal tenderness. There is no right CVA tenderness or left CVA tenderness.  Musculoskeletal:        General: Normal range of motion.     Cervical back: Normal range of motion.     Right lower leg: No edema.     Left lower leg: No edema.  Skin:    General:  Skin is warm and dry.  Neurological:     General: No focal deficit present.     Mental Status: She is alert and oriented to person, place, and time.  Psychiatric:        Mood and Affect: Mood normal.        Behavior: Behavior normal.      No results found for any visits on 12/08/23.  No results found for this  or any previous visit (from the past 2160 hours).    Assessment & Plan:  Patient will start a strict diet control and exercise program.  Needs to monitor her blood pressure.  Follow-up in 6 weeks to monitor weight and blood pressure. Problem List Items Addressed This Visit     Mixed hyperlipidemia   Essential hypertension, benign - Primary   Prediabetes   Other Visit Diagnoses       Weight gain           Return in about 6 weeks (around 01/19/2024).   Total time spent: 30 minutes  FERNAND FREDY RAMAN, MD  12/08/2023   This document may have been prepared by [facility] Voice Recognition software and as such may include unintentional dictation errors.

## 2023-12-09 ENCOUNTER — Ambulatory Visit: Admitting: Cardiovascular Disease

## 2023-12-11 ENCOUNTER — Ambulatory Visit: Admitting: Cardiovascular Disease

## 2023-12-11 ENCOUNTER — Encounter: Payer: Self-pay | Admitting: Cardiovascular Disease

## 2023-12-11 VITALS — BP 120/62 | HR 67 | Ht 63.0 in | Wt 221.0 lb

## 2023-12-11 DIAGNOSIS — I10 Essential (primary) hypertension: Secondary | ICD-10-CM | POA: Diagnosis not present

## 2023-12-11 DIAGNOSIS — R7303 Prediabetes: Secondary | ICD-10-CM

## 2023-12-11 DIAGNOSIS — I2510 Atherosclerotic heart disease of native coronary artery without angina pectoris: Secondary | ICD-10-CM

## 2023-12-11 DIAGNOSIS — E782 Mixed hyperlipidemia: Secondary | ICD-10-CM | POA: Diagnosis not present

## 2023-12-11 DIAGNOSIS — I340 Nonrheumatic mitral (valve) insufficiency: Secondary | ICD-10-CM

## 2023-12-11 NOTE — Progress Notes (Signed)
 Cardiology Office Note   Date:  12/11/2023   ID:  Amy Buckley, DOB 1971-05-12, MRN [id number]  PCP:  Fernand Fredy RAMAN, MD  Cardiologist:  Denyse Fernand, MD      History of Present Illness: Amy Buckley is a 53 y.o. female who presents for  Chief Complaint  Patient presents with   Follow-up    3 month follow up    Feels fine, but has occasional edema of legs which can be due to amlodapine.       Past Medical History:  Diagnosis Date   BRCA negative 03/2018   MyRisk neg except GALNT12 VUS   Dysrhythmia    Family history of breast cancer    Hypercholesterolemia    Increased risk of breast cancer 03/2018   IBIS=29.2%     Past Surgical History:  Procedure Laterality Date   CERVICAL CERCLAGE     CESAREAN SECTION     COLONOSCOPY WITH PROPOFOL  N/A 09/21/2021   Procedure: COLONOSCOPY WITH PROPOFOL ;  Surgeon: Jinny Carmine, MD;  Location: [facility];  Service: Endoscopy;  Laterality: N/A;   COMBINED HYSTEROSCOPY DIAGNOSTIC / D&C     ESSURE TUBAL LIGATION       Current Outpatient Medications  Medication Sig Dispense Refill   amLODipine  (NORVASC ) 5 MG tablet Take 1 tablet (5 mg total) by mouth daily. 90 tablet 3   hydrochlorothiazide  (HYDRODIURIL ) 12.5 MG tablet Take 1 tablet (12.5 mg total) by mouth daily. 90 tablet 3   rosuvastatin  (CRESTOR ) 20 MG tablet Take 1 tablet (20 mg total) by mouth daily. 90 tablet 3   No current facility-administered medications for this visit.    Allergies:   Patient has no known allergies.    Social History:   reports that she has never smoked. She has never used smokeless tobacco. She reports that she does not drink alcohol and does not use drugs.   Family History:  family history includes Breast cancer in her paternal aunt; Breast cancer (age of onset: 38) in her mother; Diabetes in her mother; Hypertension in her mother; Lung cancer (age of onset: 77) in her father.    ROS:     Review of Systems  Constitutional:  Negative.   HENT: Negative.    Eyes: Negative.   Respiratory: Negative.    Gastrointestinal: Negative.   Genitourinary: Negative.   Musculoskeletal: Negative.   Skin: Negative.   Neurological: Negative.   Endo/Heme/Allergies: Negative.   Psychiatric/Behavioral: Negative.    All other systems reviewed and are negative.     All other systems are reviewed and negative.    PHYSICAL EXAM: VS:  BP 120/62   Pulse 67   Ht 5' 3 (1.6 m)   Wt 221 lb (100.2 kg)   SpO2 99%   BMI 39.15 kg/m  , BMI Body mass index is 39.15 kg/m. Last weight:  Wt Readings from Last 3 Encounters:  12/11/23 221 lb (100.2 kg)  12/08/23 221 lb 3.2 oz (100.3 kg)  08/08/23 209 lb (94.8 kg)     Physical Exam Constitutional:      Appearance: Normal appearance.  Cardiovascular:     Rate and Rhythm: Normal rate and regular rhythm.     Heart sounds: Normal heart sounds.  Pulmonary:     Effort: Pulmonary effort is normal.     Breath sounds: Normal breath sounds.  Musculoskeletal:     Right lower leg: No edema.     Left lower leg: No edema.  Neurological:  Mental Status: She is alert.       EKG:   Recent Labs: 12/31/2022: Hemoglobin 12.7; Platelets 245; TSH 1.320 08/05/2023: ALT 15; BUN 10; Creatinine, Ser 0.75; Potassium 4.1; Sodium 140    Lipid Panel    Component Value Date/Time   CHOL 136 08/05/2023 1014   TRIG 55 08/05/2023 1014   HDL 45 08/05/2023 1014   LDLCALC 79 08/05/2023 1014      Other studies Reviewed: Additional studies/ records that were reviewed today include:  Review of the above records demonstrates:       No data to display            ASSESSMENT AND PLAN:    ICD-10-CM   1. Coronary artery disease involving native coronary artery of native heart without angina pectoris  I25.10    No chest pains    2. Essential hypertension, benign  I10    May consider changing amlodapine to hyzar if edema continues. LVEF was normal.    3. Mixed hyperlipidemia  E78.2      4. Prediabetes  R73.03     5. Nonrheumatic mitral valve regurgitation  I34.0        Problem List Items Addressed This Visit       Cardiovascular and Mediastinum   Essential hypertension, benign     Other   Mixed hyperlipidemia   Prediabetes   Other Visit Diagnoses       Coronary artery disease involving native coronary artery of native heart without angina pectoris    -  Primary   No chest pains     Nonrheumatic mitral valve regurgitation              Disposition:   Return in about 3 months (around 03/12/2024).    Total time spent: 35 minutes  Signed,  Denyse Bathe, MD  12/11/2023 3:30 PM    [facility]

## 2024-01-22 ENCOUNTER — Ambulatory Visit: Admitting: Internal Medicine

## 2024-01-22 ENCOUNTER — Encounter: Payer: Self-pay | Admitting: Internal Medicine

## 2024-01-22 VITALS — BP 120/80 | HR 74 | Ht 63.0 in | Wt 219.6 lb

## 2024-01-22 DIAGNOSIS — M722 Plantar fascial fibromatosis: Secondary | ICD-10-CM | POA: Insufficient documentation

## 2024-01-22 DIAGNOSIS — R7303 Prediabetes: Secondary | ICD-10-CM | POA: Diagnosis not present

## 2024-01-22 DIAGNOSIS — E782 Mixed hyperlipidemia: Secondary | ICD-10-CM

## 2024-01-22 DIAGNOSIS — I10 Essential (primary) hypertension: Secondary | ICD-10-CM | POA: Diagnosis not present

## 2024-01-22 NOTE — Progress Notes (Signed)
 Established Patient Office Visit  Subjective:  Patient ID: Amy Buckley, female    DOB: 25-Jul-1970  Age: 53 y.o. MRN: [id number]  Chief Complaint  Patient presents with   Follow-up    6 week follow up    Patient is seen today for follow up visit. She reports she is doing well and following a healthy diet and exercising daily, managed to lose some weight. She denies any chest pain, shortness of breath, nausea, vomiting, diarrhea, or palpitations. She endorses pain in the bottoms of her feet in the morning and when she is on her feet for a long time. Patient has high arches. Recommended arch supports in her shoes and plantar fascitis stretches with roller ball/ice as needed.    No other concerns at this time.   Past Medical History:  Diagnosis Date   BRCA negative 03/2018   MyRisk neg except GALNT12 VUS   Dysrhythmia    Family history of breast cancer    Hypercholesterolemia    Increased risk of breast cancer 03/2018   IBIS=29.2%    Past Surgical History:  Procedure Laterality Date   CERVICAL CERCLAGE     CESAREAN SECTION     COLONOSCOPY WITH PROPOFOL  N/A 09/21/2021   Procedure: COLONOSCOPY WITH PROPOFOL ;  Surgeon: Jinny Carmine, MD;  Location: [facility];  Service: Endoscopy;  Laterality: N/A;   COMBINED HYSTEROSCOPY DIAGNOSTIC / D&C     ESSURE TUBAL LIGATION      Social History   Socioeconomic History   Marital status: Married    Spouse name: Not on file   Number of children: Not on file   Years of education: Not on file   Highest education level: Not on file  Occupational History   Not on file  Tobacco Use   Smoking status: Never   Smokeless tobacco: Never  Vaping Use   Vaping status: Never Used  Substance and Sexual Activity   Alcohol use: No   Drug use: No   Sexual activity: Yes    Birth control/protection: I.U.D.  Other Topics Concern   Not on file  Social History Narrative   Not on file   Social Drivers of Health   Financial Resource  Strain: Low Risk  (05/30/2023)   Received from [facility]   Overall Financial Resource Strain (CARDIA)    Difficulty of Paying Living Expenses: Not very hard  Food Insecurity: No Food Insecurity (05/30/2023)   Received from [facility]   Hunger Vital Sign    Within the past 12 months, you worried that your food would run out before you got the money to buy more.: Never true    Within the past 12 months, the food you bought just didn't last and you didn't have money to get more.: Never true  Transportation Needs: No Transportation Needs (05/30/2023)   Received from [facility] Transportation    In the past 12 months, has lack of transportation kept you from medical appointments or from getting medications?: No    Lack of Transportation (Non-Medical): No  Physical Activity: Not on file  Stress: Not on file  Social Connections: Not on file  Intimate Partner Violence: Not on file    Family History  Problem Relation Age of Onset   Diabetes Mother    Hypertension Mother    Breast cancer Mother 72   Lung cancer Father 84   Breast cancer Paternal Aunt  No Known Allergies  Outpatient Medications Prior to Visit  Medication Sig   amLODipine  (NORVASC ) 5 MG tablet Take 1 tablet (5 mg total) by mouth daily.   hydrochlorothiazide  (HYDRODIURIL ) 12.5 MG tablet Take 1 tablet (12.5 mg total) by mouth daily.   rosuvastatin  (CRESTOR ) 20 MG tablet Take 1 tablet (20 mg total) by mouth daily.   No facility-administered medications prior to visit.    Review of Systems  Constitutional: Negative.  Negative for chills, diaphoresis, fever and malaise/fatigue.  HENT: Negative.  Negative for sore throat.   Eyes: Negative.   Respiratory: Negative.  Negative for cough and shortness of breath.   Cardiovascular: Negative.  Negative for chest pain, palpitations and leg swelling.  Gastrointestinal: Negative.  Negative for abdominal pain,  constipation, diarrhea, heartburn, nausea and vomiting.  Genitourinary: Negative.  Negative for dysuria and flank pain.  Musculoskeletal:  Positive for myalgias. Negative for joint pain.       Bilateral Plantar fascitis  Skin: Negative.   Neurological: Negative.  Negative for dizziness and headaches.  Endo/Heme/Allergies: Negative.   Psychiatric/Behavioral: Negative.  Negative for depression and suicidal ideas. The patient is not nervous/anxious.        Objective:   BP 120/80   Pulse 74   Ht 5' 3 (1.6 m)   Wt 219 lb 9.6 oz (99.6 kg)   SpO2 99%   BMI 38.90 kg/m   Vitals:   01/22/24 0938  BP: 120/80  Pulse: 74  Height: 5' 3 (1.6 m)  Weight: 219 lb 9.6 oz (99.6 kg)  SpO2: 99%  BMI (Calculated): 38.91    Physical Exam Vitals and nursing note reviewed.  Constitutional:      Appearance: Normal appearance.  HENT:     Head: Normocephalic and atraumatic.     Nose: Nose normal.     Mouth/Throat:     Mouth: Mucous membranes are moist.     Pharynx: Oropharynx is clear.  Eyes:     Conjunctiva/sclera: Conjunctivae normal.     Pupils: Pupils are equal, round, and reactive to light.  Cardiovascular:     Rate and Rhythm: Normal rate and regular rhythm.     Pulses: Normal pulses.     Heart sounds: Normal heart sounds. No murmur heard. Pulmonary:     Effort: Pulmonary effort is normal.     Breath sounds: Normal breath sounds. No wheezing.  Abdominal:     General: Bowel sounds are normal.     Palpations: Abdomen is soft.     Tenderness: There is no abdominal tenderness. There is no right CVA tenderness or left CVA tenderness.  Musculoskeletal:        General: Normal range of motion.     Cervical back: Normal range of motion.     Right lower leg: No edema.     Left lower leg: No edema.  Skin:    General: Skin is warm and dry.  Neurological:     General: No focal deficit present.     Mental Status: She is alert and oriented to person, place, and time.  Psychiatric:         Mood and Affect: Mood normal.        Behavior: Behavior normal.      No results found for any visits on 01/22/24.  No results found for this or any previous visit (from the past 2160 hours).    Assessment & Plan:  Continue taking medications as prescribed. Add insoles with arch support in shoes and  a roller ball/ ice can provide relief to plantar fascitis. Will collect routine labs today and discuss results with patient. Problem List Items Addressed This Visit     Mixed hyperlipidemia   Relevant Orders   Lipid Panel w/o Chol/HDL Ratio   Essential hypertension, benign - Primary   Relevant Orders   CBC with Diff   CMP14+EGFR   Prediabetes   Relevant Orders   Hemoglobin A1c   Plantar fasciitis, bilateral    Return in about 3 months (around 04/23/2024).   Total time spent: 20 minutes  FERNAND FREDY RAMAN, MD  01/22/2024   This document may have been prepared by [facility] Voice Recognition software and as such may include unintentional dictation errors.

## 2024-01-23 ENCOUNTER — Ambulatory Visit: Payer: Self-pay | Admitting: Internal Medicine

## 2024-01-23 LAB — CMP14+EGFR
ALT: 17 IU/L (ref 0–32)
AST: 21 IU/L (ref 0–40)
Albumin: 4.4 g/dL (ref 3.8–4.9)
Alkaline Phosphatase: 81 IU/L (ref 44–121)
BUN/Creatinine Ratio: 13 (ref 9–23)
BUN: 11 mg/dL (ref 6–24)
Bilirubin Total: 0.7 mg/dL (ref 0.0–1.2)
CO2: 25 mmol/L (ref 20–29)
Calcium: 9.6 mg/dL (ref 8.7–10.2)
Chloride: 97 mmol/L (ref 96–106)
Creatinine, Ser: 0.84 mg/dL (ref 0.57–1.00)
Globulin, Total: 2.8 g/dL (ref 1.5–4.5)
Glucose: 87 mg/dL (ref 70–99)
Potassium: 4.1 mmol/L (ref 3.5–5.2)
Sodium: 138 mmol/L (ref 134–144)
Total Protein: 7.2 g/dL (ref 6.0–8.5)
eGFR: 84 mL/min/1.73 (ref >59)

## 2024-01-23 LAB — CBC WITH DIFFERENTIAL/PLATELET
Basophils Absolute: 0 x10E3/uL (ref 0.0–0.2)
Basos: 0 %
EOS (ABSOLUTE): 0.1 x10E3/uL (ref 0.0–0.4)
Eos: 3 %
Hematocrit: 39.8 % (ref 34.0–46.6)
Hemoglobin: 12.4 g/dL (ref 11.1–15.9)
Immature Grans (Abs): 0 x10E3/uL (ref 0.0–0.1)
Immature Granulocytes: 0 %
Lymphocytes Absolute: 2.2 x10E3/uL (ref 0.7–3.1)
Lymphs: 46 %
MCH: 28.3 pg (ref 26.6–33.0)
MCHC: 31.2 g/dL — ABNORMAL LOW (ref 31.5–35.7)
MCV: 91 fL (ref 79–97)
Monocytes Absolute: 0.4 x10E3/uL (ref 0.1–0.9)
Monocytes: 9 %
Neutrophils Absolute: 2 x10E3/uL (ref 1.4–7.0)
Neutrophils: 42 %
Platelets: 231 x10E3/uL (ref 150–450)
RBC: 4.38 x10E6/uL (ref 3.77–5.28)
RDW: 11.8 % (ref 11.7–15.4)
WBC: 4.7 x10E3/uL (ref 3.4–10.8)

## 2024-01-23 LAB — LIPID PANEL W/O CHOL/HDL RATIO
Cholesterol, Total: 139 mg/dL (ref 100–199)
HDL: 51 mg/dL (ref >39)
LDL Chol Calc (NIH): 77 mg/dL (ref 0–99)
Triglycerides: 48 mg/dL (ref 0–149)
VLDL Cholesterol Cal: 11 mg/dL (ref 5–40)

## 2024-01-23 LAB — HEMOGLOBIN A1C
Est. average glucose Bld gHb Est-mCnc: 126 mg/dL
Hgb A1c MFr Bld: 6 % — ABNORMAL HIGH (ref 4.8–5.6)

## 2024-01-23 NOTE — Progress Notes (Signed)
 Patient notified

## 2024-01-31 ENCOUNTER — Other Ambulatory Visit: Payer: Self-pay | Admitting: Internal Medicine

## 2024-01-31 DIAGNOSIS — I10 Essential (primary) hypertension: Secondary | ICD-10-CM

## 2024-02-02 ENCOUNTER — Encounter: Payer: Self-pay | Admitting: Internal Medicine

## 2024-02-02 DIAGNOSIS — E782 Mixed hyperlipidemia: Secondary | ICD-10-CM

## 2024-02-02 DIAGNOSIS — I10 Essential (primary) hypertension: Secondary | ICD-10-CM

## 2024-02-05 MED ORDER — HYDROCHLOROTHIAZIDE 12.5 MG PO TABS: 12.5000 mg | ORAL_TABLET | Freq: Every day | ORAL | 0 refills | Status: AC

## 2024-02-05 MED ORDER — ROSUVASTATIN CALCIUM 20 MG PO TABS: 20.0000 mg | ORAL_TABLET | Freq: Every day | ORAL | 3 refills | Status: DC

## 2024-02-05 MED ORDER — AMLODIPINE BESYLATE 5 MG PO TABS: 5.0000 mg | ORAL_TABLET | Freq: Every day | ORAL | 3 refills | Status: DC

## 2024-02-05 MED ORDER — HYDROCHLOROTHIAZIDE 12.5 MG PO TABS: 12.5000 mg | ORAL_TABLET | Freq: Every day | ORAL | 3 refills | Status: DC

## 2024-02-05 MED ORDER — ROSUVASTATIN CALCIUM 20 MG PO TABS: 20.0000 mg | ORAL_TABLET | Freq: Every day | ORAL | 0 refills | Status: DC

## 2024-02-05 MED ORDER — AMLODIPINE BESYLATE 5 MG PO TABS: 5.0000 mg | ORAL_TABLET | Freq: Every day | ORAL | 0 refills | Status: DC

## 2024-03-01 ENCOUNTER — Other Ambulatory Visit: Payer: Self-pay | Admitting: Internal Medicine

## 2024-03-01 DIAGNOSIS — I10 Essential (primary) hypertension: Secondary | ICD-10-CM

## 2024-03-01 DIAGNOSIS — E782 Mixed hyperlipidemia: Secondary | ICD-10-CM

## 2024-03-12 ENCOUNTER — Ambulatory Visit: Admitting: Cardiovascular Disease

## 2024-04-12 ENCOUNTER — Ambulatory Visit: Admitting: Cardiovascular Disease

## 2024-04-26 ENCOUNTER — Ambulatory Visit: Admitting: Internal Medicine

## 2024-04-26 ENCOUNTER — Encounter: Payer: Self-pay | Admitting: Internal Medicine

## 2024-04-26 VITALS — BP 124/84 | HR 86 | Ht 63.0 in | Wt 228.0 lb

## 2024-04-26 DIAGNOSIS — E782 Mixed hyperlipidemia: Secondary | ICD-10-CM | POA: Diagnosis not present

## 2024-04-26 DIAGNOSIS — Z23 Encounter for immunization: Secondary | ICD-10-CM

## 2024-04-26 DIAGNOSIS — I10 Essential (primary) hypertension: Secondary | ICD-10-CM

## 2024-04-26 DIAGNOSIS — R7303 Prediabetes: Secondary | ICD-10-CM

## 2024-04-26 NOTE — Progress Notes (Signed)
 Established Patient Office Visit  Subjective:  Patient ID: Amy Buckley, female    DOB: 01/26/1971  Age: 53 y.o. MRN: [id number]  Chief Complaint  Patient presents with   Follow-up    3 month follow up    Patient comes in for follow up. Generally feels well. Foot discomfort has resolved. However she has gained back the weight she lost before. Admits to dietary indiscretion and not exercising enough. Her BMI is 40.4 now. She does not want to use Adipex or Bontril. Also scared to start GLP1 agonist, although she is a Prediabetic. Will return for fasting labs. She will resume strict diet control and exercise, return in 6 weeks to check weight control.    No other concerns at this time.   Past Medical History:  Diagnosis Date   BRCA negative 03/2018   MyRisk neg except GALNT12 VUS   Dysrhythmia    Family history of breast cancer    Hypercholesterolemia    Increased risk of breast cancer 03/2018   IBIS=29.2%    Past Surgical History:  Procedure Laterality Date   CERVICAL CERCLAGE     CESAREAN SECTION     COLONOSCOPY WITH PROPOFOL  N/A 09/21/2021   Procedure: COLONOSCOPY WITH PROPOFOL ;  Surgeon: Jinny Carmine, MD;  Location: [facility];  Service: Endoscopy;  Laterality: N/A;   COMBINED HYSTEROSCOPY DIAGNOSTIC / D&C     ESSURE TUBAL LIGATION      Social History   Socioeconomic History   Marital status: Married    Spouse name: Not on file   Number of children: Not on file   Years of education: Not on file   Highest education level: Not on file  Occupational History   Not on file  Tobacco Use   Smoking status: Never   Smokeless tobacco: Never  Vaping Use   Vaping status: Never Used  Substance and Sexual Activity   Alcohol use: No   Drug use: No   Sexual activity: Yes    Birth control/protection: I.U.D.  Other Topics Concern   Not on file  Social History Narrative   Not on file   Social Drivers of Health   Financial Resource Strain: Low Risk   (05/30/2023)   Received from [facility]   Overall Financial Resource Strain (CARDIA)    Difficulty of Paying Living Expenses: Not very hard  Food Insecurity: No Food Insecurity (05/30/2023)   Received from [facility]   Hunger Vital Sign    Within the past 12 months, you worried that your food would run out before you got the money to buy more.: Never true    Within the past 12 months, the food you bought just didn't last and you didn't have money to get more.: Never true  Transportation Needs: No Transportation Needs (05/30/2023)   Received from [facility] - Transportation    In the past 12 months, has lack of transportation kept you from medical appointments or from getting medications?: No    Lack of Transportation (Non-Medical): No  Physical Activity: Not on file  Stress: Not on file  Social Connections: Not on file  Intimate Partner Violence: Not on file    Family History  Problem Relation Age of Onset   Diabetes Mother    Hypertension Mother    Breast cancer Mother 70   Lung cancer Father 4   Breast cancer Paternal Aunt     No Known Allergies  Outpatient  Medications Prior to Visit  Medication Sig   amLODipine  (NORVASC ) 5 MG tablet TAKE 1 TABLET (5 MG TOTAL) BY MOUTH DAILY.   hydrochlorothiazide  (HYDRODIURIL ) 12.5 MG tablet Take 1 tablet (12.5 mg total) by mouth daily.   rosuvastatin  (CRESTOR ) 20 MG tablet TAKE 1 TABLET BY MOUTH EVERY DAY   No facility-administered medications prior to visit.    Review of Systems  Constitutional: Negative.  Negative for chills, fever and malaise/fatigue.  HENT: Negative.  Negative for congestion and sore throat.   Eyes: Negative.  Negative for blurred vision and pain.  Respiratory: Negative.  Negative for cough and shortness of breath.   Cardiovascular: Negative.  Negative for chest pain, palpitations and leg swelling.  Gastrointestinal: Negative.  Negative for abdominal  pain, blood in stool, constipation, diarrhea, heartburn, melena, nausea and vomiting.  Genitourinary: Negative.  Negative for dysuria, flank pain, frequency and urgency.  Musculoskeletal: Negative.  Negative for joint pain and myalgias.  Skin: Negative.   Neurological: Negative.  Negative for dizziness, tingling, sensory change, weakness and headaches.  Endo/Heme/Allergies: Negative.   Psychiatric/Behavioral: Negative.  Negative for depression and suicidal ideas. The patient is not nervous/anxious.        Objective:   BP 124/84   Pulse 86   Ht 5' 3 (1.6 m)   Wt 228 lb (103.4 kg)   SpO2 99%   BMI 40.39 kg/m   Vitals:   04/26/24 1438  BP: 124/84  Pulse: 86  Height: 5' 3 (1.6 m)  Weight: 228 lb (103.4 kg)  SpO2: 99%  BMI (Calculated): 40.4    Physical Exam Vitals and nursing note reviewed.  Constitutional:      Appearance: Normal appearance.  HENT:     Head: Normocephalic and atraumatic.     Nose: Nose normal.     Mouth/Throat:     Mouth: Mucous membranes are moist.     Pharynx: Oropharynx is clear.  Eyes:     Conjunctiva/sclera: Conjunctivae normal.     Pupils: Pupils are equal, round, and reactive to light.  Cardiovascular:     Rate and Rhythm: Normal rate and regular rhythm.     Pulses: Normal pulses.     Heart sounds: Normal heart sounds. No murmur heard. Pulmonary:     Effort: Pulmonary effort is normal.     Breath sounds: Normal breath sounds. No wheezing.  Abdominal:     General: Bowel sounds are normal.     Palpations: Abdomen is soft.     Tenderness: There is no abdominal tenderness. There is no right CVA tenderness or left CVA tenderness.  Musculoskeletal:        General: Normal range of motion.     Cervical back: Normal range of motion.     Right lower leg: No edema.     Left lower leg: No edema.  Skin:    General: Skin is warm and dry.  Neurological:     General: No focal deficit present.     Mental Status: She is alert and oriented to  person, place, and time.  Psychiatric:        Mood and Affect: Mood normal.        Behavior: Behavior normal.      No results found for any visits on 04/26/24.  No results found for this or any previous visit (from the past 2160 hours).    Assessment & Plan:  Strict diet control and weight reduction emphasized. Fasting labs. Problem List Items Addressed This Visit  Mixed hyperlipidemia   Relevant Orders   Lipid Panel w/o Chol/HDL Ratio   Essential hypertension, benign - Primary   Relevant Orders   CMP14+EGFR   Prediabetes   Relevant Orders   Hemoglobin A1c   Morbid obesity with body mass index (BMI) of 40.0 or higher (HCC)    Return in about 6 weeks (around 06/07/2024).   Total time spent: 30 minutes. This time includes review of previous notes and results and patient face to face interaction during today's visit.    FERNAND FREDY RAMAN, MD  04/26/2024   This document may have been prepared by [facility] Voice Recognition software and as such may include unintentional dictation errors.

## 2024-04-26 NOTE — Addendum Note (Signed)
 Addended byBETHA ORION STABS on: 04/26/2024 03:01 PM   Modules accepted: Orders

## 2024-04-27 ENCOUNTER — Other Ambulatory Visit

## 2024-04-28 ENCOUNTER — Ambulatory Visit: Payer: Self-pay | Admitting: Internal Medicine

## 2024-04-28 LAB — CMP14+EGFR
ALT: 23 IU/L (ref 0–32)
AST: 26 IU/L (ref 0–40)
Albumin: 4.2 g/dL (ref 3.8–4.9)
Alkaline Phosphatase: 76 IU/L (ref 49–135)
BUN/Creatinine Ratio: 15 (ref 9–23)
BUN: 12 mg/dL (ref 6–24)
Bilirubin Total: 0.4 mg/dL (ref 0.0–1.2)
CO2: 26 mmol/L (ref 20–29)
Calcium: 9.5 mg/dL (ref 8.7–10.2)
Chloride: 94 mmol/L — ABNORMAL LOW (ref 96–106)
Creatinine, Ser: 0.8 mg/dL (ref 0.57–1.00)
Globulin, Total: 2.9 g/dL (ref 1.5–4.5)
Glucose: 78 mg/dL (ref 70–99)
Potassium: 4.2 mmol/L (ref 3.5–5.2)
Sodium: 136 mmol/L (ref 134–144)
Total Protein: 7.1 g/dL (ref 6.0–8.5)
eGFR: 89 mL/min/1.73 (ref >59)

## 2024-04-28 LAB — HEMOGLOBIN A1C
Est. average glucose Bld gHb Est-mCnc: 126 mg/dL
Hgb A1c MFr Bld: 6 % — ABNORMAL HIGH (ref 4.8–5.6)

## 2024-04-28 LAB — LIPID PANEL W/O CHOL/HDL RATIO
Cholesterol, Total: 148 mg/dL (ref 100–199)
HDL: 56 mg/dL (ref >39)
LDL Chol Calc (NIH): 82 mg/dL (ref 0–99)
Triglycerides: 42 mg/dL (ref 0–149)
VLDL Cholesterol Cal: 10 mg/dL (ref 5–40)

## 2024-06-07 ENCOUNTER — Ambulatory Visit: Admitting: Internal Medicine

## 2024-07-01 ENCOUNTER — Ambulatory Visit: Admitting: Internal Medicine

## 2024-07-20 ENCOUNTER — Ambulatory Visit: Admitting: Internal Medicine
